# Patient Record
Sex: Male | Born: 1975 | Race: Black or African American | Hispanic: No | Marital: Single | Smoking: Current every day smoker
Health system: Southern US, Community
[De-identification: ages and names within clinical notes are randomized; demographics above are authoritative.]

## PROBLEM LIST (undated history)

## (undated) HISTORY — PX: STOMACH SURGERY: SHX791

---

## 1997-09-01 ENCOUNTER — Emergency Department (HOSPITAL_COMMUNITY): Admission: EM | Admit: 1997-09-01 | Discharge: 1997-09-01 | Payer: Self-pay | Admitting: Emergency Medicine

## 1998-10-11 ENCOUNTER — Emergency Department (HOSPITAL_COMMUNITY): Admission: EM | Admit: 1998-10-11 | Discharge: 1998-10-11 | Payer: Self-pay | Admitting: Emergency Medicine

## 1998-10-11 ENCOUNTER — Encounter: Payer: Self-pay | Admitting: Emergency Medicine

## 2003-08-02 ENCOUNTER — Emergency Department (HOSPITAL_COMMUNITY): Admission: EM | Admit: 2003-08-02 | Discharge: 2003-08-02 | Payer: Self-pay | Admitting: Emergency Medicine

## 2004-02-10 ENCOUNTER — Emergency Department (HOSPITAL_COMMUNITY): Admission: EM | Admit: 2004-02-10 | Discharge: 2004-02-10 | Payer: Self-pay | Admitting: Emergency Medicine

## 2010-08-13 ENCOUNTER — Observation Stay (HOSPITAL_COMMUNITY)
Admission: EM | Admit: 2010-08-13 | Discharge: 2010-08-13 | Disposition: A | Discharge status: Home / Self Care | Payer: Self-pay | Attending: General Surgery | Admitting: General Surgery

## 2010-08-13 DIAGNOSIS — S45809A Unspecified injury of other specified blood vessels at shoulder and upper arm level, unspecified arm, initial encounter: Secondary | ICD-10-CM

## 2010-08-13 DIAGNOSIS — S41109A Unspecified open wound of unspecified upper arm, initial encounter: Principal | ICD-10-CM | POA: Insufficient documentation

## 2010-08-13 DIAGNOSIS — Y92009 Unspecified place in unspecified non-institutional (private) residence as the place of occurrence of the external cause: Secondary | ICD-10-CM | POA: Insufficient documentation

## 2010-08-13 DIAGNOSIS — IMO0002 Reserved for concepts with insufficient information to code with codable children: Secondary | ICD-10-CM | POA: Insufficient documentation

## 2010-08-13 DIAGNOSIS — W261XXA Contact with sword or dagger, initial encounter: Secondary | ICD-10-CM

## 2010-08-13 DIAGNOSIS — Y998 Other external cause status: Secondary | ICD-10-CM | POA: Insufficient documentation

## 2010-08-13 DIAGNOSIS — W260XXA Contact with knife, initial encounter: Secondary | ICD-10-CM

## 2010-08-13 LAB — COMPREHENSIVE METABOLIC PANEL
ALT: 27 U/L (ref 0–53)
AST: 39 U/L — ABNORMAL HIGH (ref 0–37)
Albumin: 4.4 g/dL (ref 3.5–5.2)
Alkaline Phosphatase: 67 U/L (ref 39–117)
GFR calc Af Amer: >60 mL/min (ref >60)
Glucose, Bld: 139 mg/dL — ABNORMAL HIGH (ref 70–99)
Potassium: 3.6 mEq/L (ref 3.5–5.1)
Sodium: 137 mEq/L (ref 135–145)
Total Protein: 7.4 g/dL (ref 6.0–8.3)

## 2010-08-13 LAB — TYPE AND SCREEN
Unit division: 0
Unit division: 0

## 2010-08-13 LAB — CBC
HCT: 45.2 % (ref 39.0–52.0)
Hemoglobin: 16 g/dL (ref 13.0–17.0)
MCH: 30.9 pg (ref 26.0–34.0)
MCHC: 35.4 g/dL (ref 30.0–36.0)
MCV: 87.4 fL (ref 78.0–100.0)
Platelets: 297 10*3/uL (ref 150–400)
RBC: 5.17 MIL/uL (ref 4.22–5.81)
RDW: 13.3 % (ref 11.5–15.5)
WBC: 7.8 10*3/uL (ref 4.0–10.5)

## 2010-08-13 LAB — PROTIME-INR: INR: 0.88 (ref 0.00–1.49)

## 2010-08-13 LAB — LACTIC ACID, PLASMA: Lactic Acid, Venous: 7.4 mmol/L — ABNORMAL HIGH (ref 0.5–2.2)
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
## 2010-08-28 ENCOUNTER — Ambulatory Visit: Payer: Self-pay | Admitting: Vascular Surgery
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
## 2012-06-24 ENCOUNTER — Emergency Department (HOSPITAL_COMMUNITY)
Admission: EM | Admit: 2012-06-24 | Discharge: 2012-06-24 | Disposition: A | Discharge status: Home / Self Care | Payer: Self-pay | Attending: Emergency Medicine | Admitting: Emergency Medicine

## 2012-06-24 ENCOUNTER — Encounter (HOSPITAL_COMMUNITY): Payer: Self-pay | Admitting: Emergency Medicine

## 2012-06-24 ENCOUNTER — Emergency Department (HOSPITAL_COMMUNITY): Payer: Self-pay

## 2012-06-24 DIAGNOSIS — M79673 Pain in unspecified foot: Secondary | ICD-10-CM

## 2012-06-24 DIAGNOSIS — M79609 Pain in unspecified limb: Secondary | ICD-10-CM | POA: Insufficient documentation

## 2012-06-24 DIAGNOSIS — F17200 Nicotine dependence, unspecified, uncomplicated: Secondary | ICD-10-CM | POA: Insufficient documentation

## 2012-06-24 MED ORDER — IBUPROFEN 800 MG PO TABS
800.0000 mg | ORAL_TABLET | Freq: Once | ORAL | Status: AC
  Administered 2012-06-24: 800 mg via ORAL
  Filled 2012-06-24: qty 1

## 2012-06-24 MED ORDER — TRAMADOL HCL 50 MG PO TABS
50.0000 mg | ORAL_TABLET | Freq: Once | ORAL | Status: AC
  Administered 2012-06-24: 50 mg via ORAL
  Filled 2012-06-24: qty 1

## 2012-06-24 MED ORDER — TRAMADOL HCL 50 MG PO TABS: 50.0000 mg | ORAL_TABLET | Freq: Four times a day (QID) | ORAL | Status: AC | PRN
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
## 2012-06-24 NOTE — ED Notes (Signed)
Ortho tech bedside 

## 2012-06-25 NOTE — ED Provider Notes (Signed)
Medical screening examination/treatment/procedure(s) were performed by non-physician practitioner and as supervising physician I was immediately available for consultation/collaboration.   Laray Anger, DO 06/25/12 1222

## 2015-07-05 ENCOUNTER — Encounter (HOSPITAL_COMMUNITY): Payer: Self-pay | Admitting: *Deleted

## 2015-07-05 DIAGNOSIS — K0889 Other specified disorders of teeth and supporting structures: Secondary | ICD-10-CM | POA: Insufficient documentation

## 2015-07-05 DIAGNOSIS — K029 Dental caries, unspecified: Secondary | ICD-10-CM | POA: Insufficient documentation

## 2015-07-05 DIAGNOSIS — F17200 Nicotine dependence, unspecified, uncomplicated: Secondary | ICD-10-CM | POA: Insufficient documentation

## 2015-07-05 NOTE — ED Notes (Signed)
Pt c/o right upper dental pain x 2 weeks.  

## 2015-07-06 ENCOUNTER — Emergency Department (HOSPITAL_COMMUNITY)
Admission: EM | Admit: 2015-07-06 | Discharge: 2015-07-06 | Disposition: A | Discharge status: Home / Self Care | Payer: Self-pay | Attending: Emergency Medicine | Admitting: Emergency Medicine

## 2015-07-06 DIAGNOSIS — K0889 Other specified disorders of teeth and supporting structures: Secondary | ICD-10-CM

## 2015-07-06 MED ORDER — HYDROCODONE-ACETAMINOPHEN 5-325 MG PO TABS
1.0000 | ORAL_TABLET | Freq: Once | ORAL | Status: AC
  Administered 2015-07-06: 1 via ORAL
  Filled 2015-07-06: qty 1

## 2015-07-06 MED ORDER — PENICILLIN V POTASSIUM 500 MG PO TABS: 500.0000 mg | ORAL_TABLET | Freq: Four times a day (QID) | ORAL | Status: AC

## 2015-07-06 MED ORDER — PENICILLIN V POTASSIUM 250 MG PO TABS
500.0000 mg | ORAL_TABLET | Freq: Once | ORAL | Status: AC
  Administered 2015-07-06: 500 mg via ORAL
  Filled 2015-07-06: qty 2

## 2015-07-06 MED ORDER — HYDROCODONE-ACETAMINOPHEN 5-325 MG PO TABS: 1.0000 | ORAL_TABLET | Freq: Four times a day (QID) | ORAL | Status: AC | PRN

## 2015-07-06 NOTE — ED Provider Notes (Signed)
CSN: [id number]     Arrival date & time 07/05/15  2357 History   First MD Initiated Contact with Patient 07/06/15 0014     Chief Complaint  Patient presents with  . Dental Pain   Patient is a 40 y.o. male presenting with tooth pain.  Dental Pain Location:  Upper Upper teeth location:  1/RU 3rd molar Quality:  Aching Severity:  Severe Onset quality:  Gradual Duration:  2 weeks Timing:  Constant Progression:  Worsening Context: dental caries   Relieved by:  Nothing Worsened by:  Hot food/drink, cold food/drink and touching Ineffective treatments:  Ice Associated symptoms: no difficulty swallowing, no drooling, no facial pain, no facial swelling, no fever, no neck swelling and no trismus    Joshua Mcmillan is a 40 year old male presenting with dental pain. Onset of pain was 2 weeks ago. It is located in his right upper molar. The pain is "bearable" during the day but worsens at night. He states that tonight is the worst it has ever been. He does not have a dentist. He denies difficulty swallowing, handling secretions or breathing. Denies fevers, chills, headaches, facial swelling, facial redness, neck pain, neck swelling, chest pain or SOB.   History reviewed. No pertinent past medical history. Past Surgical History  Procedure Laterality Date  . Stomach surgery     No family history on file. Social History  Substance Use Topics  . Smoking status: Current Every Day Smoker  . Smokeless tobacco: None  . Alcohol Use: No    Review of Systems  Constitutional: Negative for fever.  HENT: Positive for dental problem. Negative for drooling and facial swelling.   All other systems reviewed and are negative.     Allergies  Review of patient's allergies indicates no known allergies.  Home Medications   Prior to Admission medications   Medication Sig Start Date End Date Taking? Authorizing Provider  HYDROcodone-acetaminophen (NORCO/VICODIN) 5-325 MG tablet Take 1 tablet by mouth every 6  (six) hours as needed. 07/06/15   Christeen Lai, PA-C  penicillin v potassium (VEETID) 500 MG tablet Take 1 tablet (500 mg total) by mouth 4 (four) times daily. 07/06/15 07/13/15  Nealie Mchatton, PA-C  traMADol (ULTRAM) 50 MG tablet Take 1 tablet (50 mg total) by mouth every 6 (six) hours as needed for pain. 06/24/12   Robyn M Hess, PA-C   BP 145/101 mmHg  Pulse 73  Temp(Src) 97.6 F (36.4 C) (Oral)  Resp 14  SpO2 98% Physical Exam  Constitutional: He appears well-developed and well-nourished. No distress.  HENT:  Head: Normocephalic and atraumatic.  Right Ear: External ear normal.  Left Ear: External ear normal.  Mouth/Throat: Uvula is midline and oropharynx is clear and moist. Mucous membranes are not dry. No trismus in the jaw. No dental abscesses or uvula swelling.  Severe dental decay of right upper 3rd molar. No erythema or drainage from the surrounding gumline. No obvious dental abscess. No trismus. No erythema or soft tissue swelling of the cheek.   Eyes: Conjunctivae are normal. Right eye exhibits no discharge. Left eye exhibits no discharge. No scleral icterus.  Neck: Normal range of motion. Neck supple.  FROM of neck intact without pain. No TTP. No cervical adenopathy. No soft tissue swelling of the neck.   Cardiovascular: Normal rate.   Pulmonary/Chest: Effort normal. No stridor. No respiratory distress.  Musculoskeletal: Normal range of motion.  Lymphadenopathy:    He has no cervical adenopathy.  Neurological: He is alert. Coordination normal.  Skin: Skin is warm and dry.  Psychiatric: He has a normal mood and affect. His behavior is normal.  Nursing note and vitals reviewed.   ED Course  Procedures (including critical care time) Labs Review Labs Reviewed - No data to display  Imaging Review No results found. I have personally reviewed and evaluated these images and lab results as part of my medical decision-making.   EKG Interpretation None      MDM   Final  diagnoses:  Pain, dental   Patient presenting with tooth pain. No obvious abscess on exam. No soft tissue swelling of the cheek or neck. Exam unconcerning for Ludwig's angina or spread of infection.  Pain controlled in ED with vicodin. Will discharge with penicillin and encouraged pt to follow-up with dentist. Resource guide given in discharge paperwork. Return precautions discussed with pt and given in discharge paperwork. Stable for discharge.      Rolm Gala Anyla Israelson, PA-C 07/06/15 1610  Shon Baton, MD 07/06/15 [phone]

## 2015-07-06 NOTE — ED Notes (Signed)
Patient able to ambulate independently  

## 2015-07-06 NOTE — Discharge Instructions (Signed)
Dental Pain  ° ° °Dental pain may be caused by many things, including:  °Tooth decay (cavities or caries). Cavities expose the nerve of your tooth to air and hot or cold temperatures. This can cause pain or discomfort.  °Abscess or infection. A dental abscess is a collection of infected pus from a bacterial infection in the inner part of the tooth (pulp). It usually occurs at the end of the tooth's root.  °Injury.  °An unknown reason (idiopathic). °Your pain may be mild or severe. It may only occur when:  °You are chewing.  °You are exposed to hot or cold temperature.  °You are eating or drinking sugary foods or beverages, such as soda or candy. °Your pain may also be constant.  °HOME CARE INSTRUCTIONS  °Watch your dental pain for any changes. The following actions may help to lessen any discomfort that you are feeling:  °Take medicines only as directed by your dentist.  °If you were prescribed an antibiotic medicine, finish all of it even if you start to feel better.  °Keep all follow-up visits as directed by your dentist. This is important.  °Do not apply heat to the outside of your face.  °Rinse your mouth or gargle with salt water if directed by your dentist. This helps with pain and swelling.  °You can make salt water by adding ¼ tsp of salt to 1 cup of warm water. °Apply ice to the painful area of your face:  °Put ice in a plastic bag.  °Place a towel between your skin and the bag.  °Leave the ice on for 20 minutes, 2-3 times per day. °Avoid foods or drinks that cause you pain, such as:  °Very hot or very cold foods or drinks.  °Sweet or sugary foods or drinks. °SEEK MEDICAL CARE IF:  °Your pain is not controlled with medicines.  °Your symptoms are worse.  °You have new symptoms. °SEEK IMMEDIATE MEDICAL CARE IF:  °You are unable to open your mouth.  °You are having trouble breathing or swallowing.  °You have a fever.  °Your face, neck, or jaw is swollen. °This information is not intended to replace advice  given to you by your health care provider. Make sure you discuss any questions you have with your health care provider.  °Document Released: 04/22/2005 Document Revised: 09/06/2014 Document Reviewed: 04/18/2014  °Elsevier Interactive Patient Education ©2016 Elsevier Inc.  ° ° °Emergency Department Resource Guide °1) Find a Doctor and Pay Out of Pocket °Although you won't have to find out who is covered by your insurance plan, it is a good idea to ask around and get recommendations. You will then need to call the office and see if the doctor you have chosen will accept you as a new patient and what types of options they offer for patients who are self-pay. Some doctors offer discounts or will set up payment plans for their patients who do not have insurance, but you will need to ask so you aren't surprised when you get to your appointment. ° °2) Contact Your Local Health Department °Not all health departments have doctors that can see patients for sick visits, but many do, so it is worth a call to see if yours does. If you don't know where your local health department is, you can check in your phone book. The CDC also has a tool to help you locate your state's health department, and many state websites also have listings of all of their local health departments. ° °  3) Find a Walk-in Clinic °If your illness is not likely to be very severe or complicated, you may want to try a walk in clinic. These are popping up all over the country in pharmacies, drugstores, and shopping centers. They're usually staffed by nurse practitioners or physician assistants that have been trained to treat common illnesses and complaints. They're usually fairly quick and inexpensive. However, if you have serious medical issues or chronic medical problems, these are probably not your best option. ° °No Primary Care Doctor: °- Call [facility] at  832-8000 - they can help you locate a primary care doctor that  accepts your insurance,  provides certain services, etc. °- Physician Referral Service- 1-800-533-3463 ° °Chronic Pain Problems: °Organization         Address  Phone   Notes  °[facility]  (336) 297-2271 Patients need to be referred by their primary care doctor.  ° °Medication Assistance: °Organization         Address  Phone   Notes  °[place] [facility] [address] °[place], [place] [address] (336) 641-8030 --Must be a resident of [place] °-- Must have NO insurance coverage whatsoever (no Medicaid/ Medicare, etc.) °-- The pt. MUST have a primary care doctor that directs their care regularly and follows them in the community °  °[facility]  (866) 331-1348   °[employer]  (888) 892-1162   ° °Agencies that provide inexpensive medical care: °Organization         Address  Phone   Notes  °[facility]  (336) 832-8035   °[facility]    (336) 832-7272   °[facility] [address] °[place], [place] [address] (336) 832-4777   °[facility] [address], °[place] (336) 271-4999   °[facility]    (336) 373-0678   °[facility]    (336) 272-1050   °[facility] ° [address], [place] Phone:  (336) 832-4444, Fax:  (336) 832-4440 Hours of Operation:  9 am - 6 pm, M-F.  Also accepts Medicaid/Medicare and self-pay.  °[facility] ° [address], [place] Phone: (336) 832-3150, Fax: (336) 832-3151. Hours of Operation:  8:30 am - 5:30 pm, M-F.  Also accepts Medicaid and self-pay.  °[facility] [place] [address], [place] Phone: (336) 878-6027   °[facility] [address], [place], [place] (336)723-1848, Ext. 123 Mondays & Thursdays: 7-9 AM.  First 15 patients are seen on a first come, first serve basis. °  ° °Medicaid-accepting [place] Providers: ° °Organization         Address  Phone    Notes  °[facility] [address], [place] (336) 641-2100 Also accepts self-pay patients.  °[facility] [address], [place] ° (336) 856-9996   °[facility] [address], [place] (336) 288-8857   °[facility] [address] [place] [address], [place] (336) 299-7000   °Veita Bland [address],   ° (336) 373-1557 Only accepts [place] Access Medicaid patients after they have their name applied to their card.  ° °Self-Pay (no insurance) in [place]: ° °Organization         Address  Phone   Notes  °Sickle Cell Patients, [facility] [address], [place] (336) 832-1970   °[facility] [address], [place] (336) 832-4400   °[facility] ° [address], [place] (336) 992-4800   °[facility]/Dr. Osei-Bonsu ° [address] [place] [address], [place] or [address], [place] (336) 841-8500 Phone number for both [place] and [place] locations is the same.  °[facility] [address], [place] (336) 299-0000   °[facility] [address] [place] [address], [place] or [address] (336) 852-7530 °(336) 878-2260   °[facility] [address], [place] (336) 350-1642, phone; (336) 294-5005, fax Sees patients 1st and 3rd Saturday of every month.  Must not qualify for public or private insurance (i.e. Medicaid, Medicare, [place] Health Choice, Veterans' Benefits) • Household income should be no more than 200% of the poverty level •The clinic cannot treat you if you are pregnant or think you are pregnant • Sexually transmitted diseases are not treated at the clinic.  ° ° °Dental Care: °Organization         Address  Phone  Notes  °[facility] [facility] [address], [place] (336)  641-6152 Accepts children up to age 21 who are enrolled in Medicaid or [place] Health Choice; pregnant women with a Medicaid card; and children who have applied for Medicaid or [place] Health Choice, but were declined, whose parents can pay a reduced fee at time of service.  °[facility] [place]  [address], [place] (336) 641-7733 Accepts children up to age 21 who are enrolled in Medicaid or [place] Health Choice; pregnant women with a Medicaid card; and children who have applied for Medicaid or [place] Health Choice, but were declined, whose parents can pay a reduced fee at time of service.  °[facility] ° [address], [place] (336) 641-4533 Patients are seen by appointment only. Walk-ins are not accepted. [facility] will see patients 18 years of age and older. °Monday - Tuesday (8am-5pm) °Most Wednesdays (8:30-5pm) °$30 per visit, cash only  °[facility] ° [address], [place] (336) 641-4533 Patients are seen by appointment only. Walk-ins are not accepted. [facility] will see patients 18 years of age and older. °One Wednesday Evening (Monthly: Volunteer Based).  $30 per visit, cash only  °[facility]  (919) 537-3737 for adults; Children under age 4, call Graduate Pediatric Dentistry at (919) [phone]. Children aged 4-14, please call (919) 537-3737 to request a pediatric application. ° Dental services are provided in all areas of dental care including fillings, crowns and bridges, complete and partial dentures, implants, gum treatment, root canals, and extractions. Preventive care is also provided. Treatment is provided to both adults and children. °Patients are selected via a lottery and there is often a waiting list. °  °[facility] [address], ° ° (336) 763-8833 [email or web address] °  °[facility] [address], [place], [place] (336)723-1848, Ext.  123 Second and Fourth Thursday of each month, opens at 6:30 AM; Clinic ends at 9 AM.  Patients are seen on a first-come first-served basis, and a limited number are seen during each clinic.  ° °[facility] ° [address], [place],  (336) 723-7904   Eligibility Requirements °You must have lived in [place],   [place], or [place] counties for at least the last three months. °  You cannot be eligible for state or federal sponsored healthcare insurance, including Veterans Administration, Medicaid, or Medicare. °  You generally cannot be eligible for healthcare insurance through your employer.  °  How to apply: °Eligibility screenings are held every Tuesday and Wednesday afternoon from 1:00 pm until 4:00 pm. You do not need an appointment for the interview!  °[facility] [address], [place], [place] 336-631-2330   °[facility] [facility]  336-342-8273   °[facility]  336-703-3100   °[facility]  336-570-6415   ° °Behavioral Health Resources in the Community: °Intensive Outpatient Programs °Organization         Address  Phone  Notes  °[facility] [address], [place], [place] 336-878-6098   °[facility] [address], [place], [place] 336-832-9800   °[facility] [address], [place], [place] ° 336-882-2125   °[facility] [address],  °[place], [place] 1-800-853-5163 or 336-641-4981   °Substance Abuse Resources °Organization         Address  Phone  Notes  °[facility]  336-882-2125   °[facility]  336-784-9470   °[facility]  336-285-9073   °[facility]  336-845-3988   °Residential & Outpatient Substance Abuse Program  1-800-659-3381   °Psychological Services °Organization         Address  Phone  Notes  °[facility]  336- 832-9600   °[facility]  336- 378-7881   °[place] [address], [place] 1-800-853-5163 or 336-641-4981   ° °Mobile Crisis Teams °Organization         Address  Phone  Notes  °[facility], Mobile Crisis Care Unit  1-877-626-1772   °Assertive °[facility] ° [address] [place], [place] 336-834-9664   °Sharon DeEsch [address] °[place] 336-554-5454   ° °Self-Help/Support Groups °Organization         Address  Phone             Notes  °[facility] - variety of support groups  336- 373-1402 Call for more information  °Narcotics Anonymous (NA), [facility] [address], °[place]  2 meetings at this location  ° °Residential Treatment Programs °Organization         Address  Phone  Notes  °[facility] [address],    °[place]  1-866-801-8205   °[facility] ° [address], [place], [place] 704-293-8524   °[facility] [address], [place] 336-845-3988 Admissions: 8am-3pm M-F  °[facility] [address]    °[place], [place] 336-841-1104   °[facility] [address], [place], [place] 336-379-7146   °[facility] [address]  °[place], [place] 336-285-9073   °[facility] - Intensive Outpatient [address], [place], [place] 336-852-3033   °[facility] [address]  °[place], [place] 1-877-615-2722 or 336-784-9470   °[facility] [address] [place],  336-227-7417 Accepts Medicaid  °[facility] [address]  °[place]  1-800-659-3381 Substance Abuse/Addiction Treatment  ° °[place] Behavioral Health Resources °Organization         Address  Phone  Notes  °[facility]  (888) 581-9988   °Julie Brannon, PhD [address] [place], [place]   (336) 349-5553 or (336) 951-0000   °[facility]   [address] °[place], [place] (336) 349-4454   °[facility] [address], [place], [place] (336) 342-8316 Insurance/Medicaid/sponsorship through [facility]  °[facility] [address] [place]                                    [place], [place] (336) 342-8316 Therapy/tele-psych/case  °[facility] [address]  ° [place], [place] (336) 349-2233    °Dr. Arfeen  (336) 349-4544   °[facility] [place]  [employer] [place] [facility] 1) [address], [place] °2) [address], [place] °3)  [address], [place] (336) 349-3220 °(336) 342-7768 ° °(336) 342-8140   °[place] [facility] (336) 342-1394 or (336) 342-3537 (After Hours)    ° ° ° °

## 2015-09-21 ENCOUNTER — Emergency Department (HOSPITAL_COMMUNITY)
Admission: EM | Admit: 2015-09-21 | Discharge: 2015-09-21 | Disposition: A | Discharge status: Home / Self Care | Payer: Self-pay | Attending: Emergency Medicine | Admitting: Emergency Medicine

## 2015-09-21 ENCOUNTER — Encounter (HOSPITAL_COMMUNITY): Payer: Self-pay | Admitting: Emergency Medicine

## 2015-09-21 DIAGNOSIS — K0889 Other specified disorders of teeth and supporting structures: Secondary | ICD-10-CM | POA: Insufficient documentation

## 2015-09-21 DIAGNOSIS — R51 Headache: Secondary | ICD-10-CM | POA: Insufficient documentation

## 2015-09-21 DIAGNOSIS — K0381 Cracked tooth: Secondary | ICD-10-CM | POA: Insufficient documentation

## 2015-09-21 DIAGNOSIS — F17210 Nicotine dependence, cigarettes, uncomplicated: Secondary | ICD-10-CM | POA: Insufficient documentation

## 2015-09-21 DIAGNOSIS — K029 Dental caries, unspecified: Secondary | ICD-10-CM | POA: Insufficient documentation

## 2015-09-21 DIAGNOSIS — H9201 Otalgia, right ear: Secondary | ICD-10-CM | POA: Insufficient documentation

## 2015-09-21 MED ORDER — HYDROCODONE-ACETAMINOPHEN 5-325 MG PO TABS: 1.0000 | ORAL_TABLET | Freq: Once | ORAL | Status: DC

## 2015-09-21 MED ORDER — AMOXICILLIN 500 MG PO CAPS: 500.0000 mg | ORAL_CAPSULE | Freq: Three times a day (TID) | ORAL | Status: DC

## 2015-09-21 MED ORDER — AMOXICILLIN 500 MG PO CAPS
500.0000 mg | ORAL_CAPSULE | Freq: Once | ORAL | Status: AC
  Administered 2015-09-21: 500 mg via ORAL
  Filled 2015-09-21: qty 1

## 2015-09-21 MED ORDER — OXYCODONE-ACETAMINOPHEN 5-325 MG PO TABS
1.0000 | ORAL_TABLET | ORAL | Status: DC | PRN
  Administered 2015-09-21: 1 via ORAL
  Filled 2015-09-21: qty 1

## 2015-09-21 MED ORDER — NAPROXEN 500 MG PO TABS: 500.0000 mg | ORAL_TABLET | Freq: Two times a day (BID) | ORAL | Status: DC

## 2015-09-21 NOTE — ED Notes (Signed)
Pt presents to ED for assessment of right sided dental pain.  Pt has been seen int he past for this same pain, was given penicillin and pain medication.  Pt sts he "thought I had beat it" so he did not follow up with a dentist.  Pt woke up today with severe pain, now radiating to his head.  Pt tearful during triage

## 2015-09-21 NOTE — ED Provider Notes (Signed)
CSN: [id number]     Arrival date & time 09/21/15  1624 History  By signing my name below, I, Phillis Haggis, attest that this documentation has been prepared under the direction and in the presence of [facility], NP-C. Electronically Signed: Phillis Haggis, ED Scribe. 09/21/2015. 4:57 PM.   Chief Complaint  Patient presents with  . Dental Pain   Patient is a 40 y.o. male presenting with tooth pain. The history is provided by the patient. No language interpreter was used.  Dental Pain Location:  Upper Upper teeth location:  1/RU 3rd molar Quality:  Aching Severity:  Mild Onset quality:  Gradual Timing:  Constant Progression:  Worsening Chronicity:  Recurrent Context: poor dentition   Ineffective treatments:  Acetaminophen Associated symptoms: headaches   Associated symptoms: no fever   HPI Comments: Joshua Mcmillan is a 40 y.o. male who presents to the Emergency Department complaining of right upper molar dental pain onset earlier today. Pt reports that he has been seen in the past for the same pain, was prescribed pain medication and penicillin. He states that he had not followed up with a dentist because he "thought he had beat it." He woke up with worsening pain this morning that radiates to his head with right ear pain. He denies fever, chills, sore throat, nausea, or vomiting.    History reviewed. No pertinent past medical history. Past Surgical History  Procedure Laterality Date  . Stomach surgery     History reviewed. No pertinent family history. Social History  Substance Use Topics  . Smoking status: Current Every Day Smoker -- 0.50 packs/day    Types: Cigarettes  . Smokeless tobacco: None  . Alcohol Use: Yes     Comment: "on the weekends"    Review of Systems  Constitutional: Negative for fever and chills.  HENT: Positive for dental problem and ear pain. Negative for sore throat.   Neurological: Positive for headaches.  All other systems reviewed and are  negative.  Allergies  Review of patient's allergies indicates no known allergies.  Home Medications   Prior to Admission medications   Medication Sig Start Date End Date Taking? Authorizing Provider  amoxicillin (AMOXIL) 500 MG capsule Take 1 capsule (500 mg total) by mouth 3 (three) times daily. 09/21/15   Hope Orlene Och, NP  HYDROcodone-acetaminophen (NORCO/VICODIN) 5-325 MG tablet Take 1 tablet by mouth every 6 (six) hours as needed. 07/06/15   Stevi Barrett, PA-C  naproxen (NAPROSYN) 500 MG tablet Take 1 tablet (500 mg total) by mouth 2 (two) times daily. 09/21/15   Hope Orlene Och, NP  traMADol (ULTRAM) 50 MG tablet Take 1 tablet (50 mg total) by mouth every 6 (six) hours as needed for pain. 06/24/12   Robyn M Hess, PA-C   BP 161/90 mmHg  Pulse 75  Temp(Src) 98.3 F (36.8 C) (Oral)  Resp 18  SpO2 99% Physical Exam  Constitutional: He is oriented to person, place, and time. He appears well-developed and well-nourished. No distress.  HENT:  Head: Normocephalic and atraumatic.  Mouth/Throat: Oropharynx is clear and moist. No dental abscesses. No oropharyngeal exudate.  Right upper 3rd molar with decay and is broken  Eyes: Conjunctivae and EOM are normal. Pupils are equal, round, and reactive to light.  Neck: Normal range of motion. Neck supple.  Musculoskeletal: Normal range of motion.  Lymphadenopathy:    He has no cervical adenopathy.  Neurological: He is alert and oriented to person, place, and time.  Skin: Skin is warm and  dry.  Psychiatric: He has a normal mood and affect. His behavior is normal.    ED Course  Procedures (including critical care time) DIAGNOSTIC STUDIES: Oxygen Saturation is 99% on RA, normal by my interpretation.    COORDINATION OF CARE: 4:56 PM-Discussed treatment plan which includes naprosyn and amoxicillin with pt at bedside and pt agreed to plan.    MDM  Patient with toothache.  No gross abscess.  Exam unconcerning for Ludwig's angina or spread of  infection.  Will treat with amoxicillin and naprosyn.  Urged patient to follow-up with dentist.    Final diagnoses:  Pain due to dental caries   I personally performed the services described in this documentation, which was scribed in my presence. The recorded information has been reviewed and is accurate.    8925 Gulf CourtHope MillsM Neese, NP 09/21/15 1705  Raeford RazorStephen Kohut, MD 09/28/15 [phone]

## 2015-09-21 NOTE — Discharge Instructions (Signed)
Dental Caries °Dental caries is tooth decay. This decay can cause a hole in teeth (cavity) that can get bigger and deeper over time. °HOME CARE °· Brush and floss your teeth. Do this at least two times a day. °· Use a fluoride toothpaste. °· Use a mouth rinse if told by your dentist or doctor. °· Eat less sugary and starchy foods. Drink less sugary drinks. °· Avoid snacking often on sugary and starchy foods. Avoid sipping often on sugary drinks. °· Keep regular checkups and cleanings with your dentist. °· Use fluoride supplements if told by your dentist or doctor. °· Allow fluoride to be applied to teeth if told by your dentist or doctor. °  °This information is not intended to replace advice given to you by your health care provider. Make sure you discuss any questions you have with your health care provider. °  °Document Released: 01/30/2008 Document Revised: 05/13/2014 Document Reviewed: 04/24/2012 °Elsevier Interactive Patient Education ©2016 Elsevier Inc. °Community Resource Guide Dental °The United Way’s “211” is a great source of information about community services available.  Access by dialing 2-1-1 from anywhere in [place], or by website -  www.nc211.org.  ° °Other Local Resources (Updated 05/2015) ° °Dental  Care °  °Services ° °  °Phone Number and Address  °Cost  °[facility] For children 0 - 21 years of age:  °• Cleaning °• Tooth brushing/flossing instruction °• Sealants, fillings, crowns °• Extractions °• Emergency treatment  [phone] °[address] °[place], [place] [address] Charges based on family income.  Medicaid and some insurance plans accepted.   °  °[facility] [place] • Cleaning °• Sealants, fillings, crowns °• Extractions °• Emergency treatment [phone] °[address] °[place], [place] ° Pregnant women 18 years of age or older with a Medicaid card  °[facility] [place] • Cleaning °• Sealants, fillings, crowns °• Extractions °• Emergency treatment [phone] °[address] °[place], [place] Pregnant women 18 years of age or older with a Medicaid card  °[facility] For children 0 - 21 years of age:  °• Cleaning °• Tooth brushing/flossing instruction °• Sealants, fillings, crowns °• Extractions °• Emergency treatment °Limited orthodontic services for patients with Medicaid [phone] °[address] °[place], [place] [address] Medicaid and [place] Health Choice cover for children up to age 21 and pregnant women.  Parents of children up to age 21 without Medicaid pay a reduced fee at time of service.  °[facility] [place] For children 0 - 21 years of age:  °• Cleaning °• Tooth brushing/flossing instruction °• Sealants, fillings, crowns °• Extractions °• Emergency treatment °Limited orthodontic services for patients with Medicaid [phone] °[address] °[place], [place].  Medicaid and [place] Health Choice cover for children up to age 21 and pregnant women.  Parents of children up to age 21 without Medicaid pay a reduced fee.  °[facility] • Cleaning °• Sealants, fillings, crowns °• Extractions ° °Hours: Tuesdays and Thursdays, 4:15 - 8 pm [phone] °[address] °[place],  [address] Services free of charge to [place] residents ages 18-64 who do not have health insurance, Medicare, Medicaid, or VA benefits and fall within federal poverty guidelines  °[facility] ° ° ° Provides dental care in addition to primary medical care, nutritional counseling,   and pharmacy: °• Cleaning °• Sealants, fillings, crowns °• Extractions ° ° ° ° ° ° ° ° ° ° ° ° ° ° ° ° ° [phone] °[facility], [address] °[place], [place] ° °[phone] °[facility], [address] [place], [place] ° °[phone] °[facility] °[place], [place] ° °[phone] °[facility], [address] °[place], [place] ° °[phone] °[facility] °[address] °[place], [place] Accepts Medicaid, Medicare, most insurance.  Also provides services available to all with fees adjusted based on ability to pay.    °[facility] • Cleaning °• Tooth brushing/flossing instruction °• Sealants, fillings, crowns °• Extractions °• Emergency treatment °Hours: Tuesdays, Thursdays, and Fridays from 8 am to 5 pm by appointment only. [phone] °[address] °[place], [place] [address] [place] residents with Medicaid (depending on eligibility) and children with [place] Health Choice - call for more information.  °[facility] • Extractions only ° °Hours: 2nd and 4th Thursday of each month from 6:30 am - 9 am.   [phone] ext. [phone] °[address] °[place], [place] [address] Ages 18 and older only.  Patients are seen on a first come, first served basis.  °[facility] • Cleanings °• Fillings °• Extractions °• Orthodontics °• Endodontics °• Implants/Crowns/Bridges °• Complete and partial dentures [phone] °[place], [place] Patients must complete an application for services.  There is often a waiting list.   ° °

## 2015-09-21 NOTE — ED Notes (Signed)
Patient able to ambulate independently  

## 2016-02-07 ENCOUNTER — Encounter (HOSPITAL_COMMUNITY): Payer: Self-pay | Admitting: Emergency Medicine

## 2016-02-07 ENCOUNTER — Emergency Department (HOSPITAL_COMMUNITY): Payer: Self-pay

## 2016-02-07 ENCOUNTER — Emergency Department (HOSPITAL_COMMUNITY)
Admission: EM | Admit: 2016-02-07 | Discharge: 2016-02-07 | Disposition: A | Discharge status: Home / Self Care | Payer: Self-pay | Attending: Emergency Medicine | Admitting: Emergency Medicine

## 2016-02-07 DIAGNOSIS — F17210 Nicotine dependence, cigarettes, uncomplicated: Secondary | ICD-10-CM | POA: Insufficient documentation

## 2016-02-07 DIAGNOSIS — R0789 Other chest pain: Secondary | ICD-10-CM | POA: Insufficient documentation

## 2016-02-07 DIAGNOSIS — R030 Elevated blood-pressure reading, without diagnosis of hypertension: Secondary | ICD-10-CM | POA: Insufficient documentation

## 2016-02-07 LAB — BASIC METABOLIC PANEL
Anion gap: 10 (ref 5–15)
BUN: 10 mg/dL (ref 6–20)
CALCIUM: 9.8 mg/dL (ref 8.9–10.3)
CHLORIDE: 104 mmol/L (ref 101–111)
CO2: 27 mmol/L (ref 22–32)
CREATININE: 1.04 mg/dL (ref 0.61–1.24)
Glucose, Bld: 104 mg/dL — ABNORMAL HIGH (ref 65–99)
Potassium: 4.4 mmol/L (ref 3.5–5.1)
SODIUM: 141 mmol/L (ref 135–145)

## 2016-02-07 LAB — CBC WITH DIFFERENTIAL/PLATELET
BASOS PCT: 0 %
Basophils Absolute: 0 10*3/uL (ref 0.0–0.1)
EOS ABS: 0 10*3/uL (ref 0.0–0.7)
EOS PCT: 0 %
HCT: 46.2 % (ref 39.0–52.0)
HEMOGLOBIN: 15.6 g/dL (ref 13.0–17.0)
Lymphocytes Relative: 30 %
Lymphs Abs: 2.4 10*3/uL (ref 0.7–4.0)
MCH: 30.3 pg (ref 26.0–34.0)
MCHC: 33.8 g/dL (ref 30.0–36.0)
MCV: 89.7 fL (ref 78.0–100.0)
MONOS PCT: 4 %
Monocytes Absolute: 0.4 10*3/uL (ref 0.1–1.0)
NEUTROS PCT: 66 %
Neutro Abs: 5.2 10*3/uL (ref 1.7–7.7)
PLATELETS: 257 10*3/uL (ref 150–400)
RBC: 5.15 MIL/uL (ref 4.22–5.81)
RDW: 14.1 % (ref 11.5–15.5)
WBC: 8.1 10*3/uL (ref 4.0–10.5)

## 2016-02-07 LAB — I-STAT TROPONIN, ED
TROPONIN I, POC: 0 ng/mL (ref 0.00–0.08)
Troponin i, poc: 0.02 ng/mL (ref 0.00–0.08)

## 2016-02-07 MED ORDER — ONDANSETRON HCL 4 MG/2ML IJ SOLN
4.0000 mg | Freq: Once | INTRAMUSCULAR | Status: AC
  Administered 2016-02-07: 4 mg via INTRAVENOUS
  Filled 2016-02-07: qty 2

## 2016-02-07 MED ORDER — IOPAMIDOL (ISOVUE-370) INJECTION 76%
INTRAVENOUS | Status: AC
  Administered 2016-02-07: 100 mL
  Filled 2016-02-07: qty 100

## 2016-02-07 MED ORDER — METHOCARBAMOL 500 MG PO TABS: 1000.0000 mg | ORAL_TABLET | Freq: Four times a day (QID) | ORAL | 0 refills | Status: DC | PRN

## 2016-02-07 MED ORDER — MORPHINE SULFATE (PF) 4 MG/ML IV SOLN
4.0000 mg | Freq: Once | INTRAVENOUS | Status: AC
  Administered 2016-02-07: 4 mg via INTRAVENOUS
  Filled 2016-02-07: qty 1

## 2016-02-07 MED ORDER — HYDROMORPHONE HCL 1 MG/ML IJ SOLN
0.5000 mg | Freq: Once | INTRAMUSCULAR | Status: AC
  Administered 2016-02-07: 0.5 mg via INTRAVENOUS
  Filled 2016-02-07: qty 1

## 2016-02-07 NOTE — ED Provider Notes (Signed)
[facility]-EMERGENCY DEPT Provider Note   CSN: [id number] Arrival date & time: 02/07/16  1191     History   Chief Complaint Chief Complaint  Patient presents with  . Chest Pain  . Shoulder Pain    HPI  Joshua Mcmillan is a 40 y.o. male brought in by EMS complaining of severe left posterior shoulder pain radiating to the left chest which he describes as pressure-like, 10 out of 10, woke him from sleep at night onset yesterday at 7 AM. No pain medication taken prior to arrival, patient denies fever, chills, cough, shortness of breath, nausea, vomiting, diaphoresis, trauma, history of DVT/PE, recent immobilizations, calf pain, leg swelling.  She was given full dose aspirin and one sublingual nitroglycerin by EMS, states that his pain has not improved. Patient is right-hand-dominant.  HPI  History reviewed. No pertinent past medical history.  There are no active problems to display for this patient.   Past Surgical History:  Procedure Laterality Date  . STOMACH SURGERY         Home Medications    Prior to Admission medications   Medication Sig Start Date End Date Taking? Authorizing Provider  HYDROcodone-acetaminophen (NORCO/VICODIN) 5-325 MG tablet Take 1 tablet by mouth every 6 (six) hours as needed. Patient not taking: Reported on 02/07/2016 07/06/15   Rolm Gala Barrett, PA-C  methocarbamol (ROBAXIN) 500 MG tablet Take 2 tablets (1,000 mg total) by mouth 4 (four) times daily as needed (Pain). 02/07/16   Louanne Calvillo, PA-C  naproxen (NAPROSYN) 500 MG tablet Take 1 tablet (500 mg total) by mouth 2 (two) times daily. Patient not taking: Reported on 02/07/2016 09/21/15   Janne Napoleon, NP  traMADol (ULTRAM) 50 MG tablet Take 1 tablet (50 mg total) by mouth every 6 (six) hours as needed for pain. Patient not taking: Reported on 02/07/2016 06/24/12   Kathrynn Speed, PA-C    Family History No family history on file.  Social History Social History  Substance Use Topics  . Smoking status:  Current Every Day Smoker    Packs/day: 0.50    Types: Cigarettes  . Smokeless tobacco: Never Used  . Alcohol use Yes     Comment: "on the weekends"     Allergies   Review of patient's allergies indicates no known allergies.   Review of Systems Review of Systems   10 systems reviewed and found to be negative, except as noted in the HPI.  Physical Exam Updated Vital Signs BP 155/87 (BP Location: Right Arm)   Pulse 78   Temp 98.1 F (36.7 C) (Oral)   Resp 18   Ht 5\' 3"  (1.6 m)   Wt 63.5 kg   SpO2 99%   BMI 24.80 kg/m   Physical Exam  Constitutional: He is oriented to person, place, and time. He appears well-developed and well-nourished. No distress.  Tearful, in severe pain  HENT:  Head: Normocephalic and atraumatic.  Mouth/Throat: Oropharynx is clear and moist.  Eyes: Conjunctivae and EOM are normal. Pupils are equal, round, and reactive to light.  Neck: Normal range of motion. No JVD present. No tracheal deviation present.  Cardiovascular: Normal rate, regular rhythm and intact distal pulses.   Radial pulse equal bilaterally  Pulmonary/Chest: Effort normal and breath sounds normal. No stridor. No respiratory distress. He has no wheezes. He has no rales. He exhibits no tenderness.  Abdominal: Soft. He exhibits no distension and no mass. There is no tenderness. There is no rebound and no guarding.  Musculoskeletal: Normal  range of motion. He exhibits no edema or tenderness.       Arms: Full range of motion to left shoulder, point tenderness to palpation as diagrammed.  No calf asymmetry, superficial collaterals, palpable cords, edema, Homans sign negative bilaterally.    Neurological: He is alert and oriented to person, place, and time.  Skin: Skin is warm. He is not diaphoretic.  Psychiatric: He has a normal mood and affect.  Nursing note and vitals reviewed.    ED Treatments / Results  Labs (all labs ordered are listed, but only abnormal results are  displayed) Labs Reviewed  BASIC METABOLIC PANEL - Abnormal; Notable for the following:       Result Value   Glucose, Bld 104 (*)    All other components within normal limits  CBC WITH DIFFERENTIAL/PLATELET  Rosezena Sensor, ED  Rosezena Sensor, ED    EKG  EKG Interpretation  Date/Time:  Wednesday February 07 2016 08:41:40 EDT Ventricular Rate:  72 PR Interval:    QRS Duration: 83 QT Interval:  347 QTC Calculation: 380 R Axis:   67 Text Interpretation:  Sinus rhythm RSR' in V1 or V2, probably normal variant ST elev, probable normal early repol pattern Artifact in lead(s) V5 V6 I III No old tracing to compare Confirmed by KOHUT  MD, STEPHEN [phone]) on 02/07/2016 8:55:55 AM       Radiology Dg Chest 2 View  Result Date: 02/07/2016 CLINICAL DATA:  Severe left upper chest pressure and pain for 2 days, smoking history EXAM: CHEST  2 VIEW COMPARISON:  None. FINDINGS: No active infiltrate or effusion is seen. No pneumothorax is noted. Mediastinal and hilar contours are unremarkable. The heart is within normal limits in size. No bony abnormality is seen. IMPRESSION: No active cardiopulmonary disease. Electronically Signed   By: Dwyane Dee M.D.   On: 02/07/2016 11:05   Ct Angio Chest Aorta W And/or Wo Contrast  Result Date: 02/07/2016 CLINICAL DATA:  40 year old male with a history of left-sided chest pain EXAM: CT CHEST WITH CONTRAST TECHNIQUE: Multidetector CT imaging of the chest was performed during intravenous contrast administration. CONTRAST:  100 cc Isovue 3 7 COMPARISON:  None. FINDINGS: Vascular: Unremarkable course caliber and contour of the thoracic aorta. No significant atherosclerotic changes. No aneurysm or dissection flap. 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 This vessel contributes to the right subclavian artery, right common carotid artery, and left common carotid artery. Left subclavian artery originates  in the expected location, more distal to the proximal brachiocephalic trunk. No central, lobar, segmental, or subsegmental filling defects of the pulmonary arteries. Cardiac: Heart size within normal limits. No pericardial fluid/ thickening. No significant coronary calcifications. Mediastinum/Nodes: No enlarged mediastinal, hilar, or axillary lymph nodes. Thyroid gland, trachea, and esophagus demonstrate no significant findings. Lungs/Pleura: Lungs are clear. No pleural effusion or pneumothorax. Upper Abdomen: No acute abnormality. Musculoskeletal: No chest wall abnormality. No acute or significant osseous findings. IMPRESSION: No acute finding to account for the patient's symptoms. Signed, Yvone Neu. Loreta Ave, DO [facility] Radiology Electronically Signed   By: Gilmer Mor D.O.   On: 02/07/2016 12:33    Procedures Procedures (including critical care time)  Medications Ordered in ED Medications  morphine 4 MG/ML injection 4 mg (4 mg Intravenous Given 02/07/16 1001)  ondansetron (ZOFRAN) injection 4 mg (4 mg Intravenous Given 02/07/16 1001)  HYDROmorphone (DILAUDID) injection 0.5 mg (0.5 mg Intravenous Given  02/07/16 1157)  iopamidol (ISOVUE-370) 76 % injection (100 mLs  Contrast Given 02/07/16 1203)     Initial Impression / Assessment and Plan / ED Course  I have reviewed the triage vital signs and the nursing notes.  Pertinent labs & imaging results that were available during my care of the patient were reviewed by me and considered in my medical decision making (see chart for details).  Clinical Course    Vitals:   02/07/16 1145 02/07/16 1415 02/07/16 1445 02/07/16 1457  BP: (!) 151/118 (!) 161/106 (!) 155/103 155/87  Pulse: 62 (!) 39 82 78  Resp: 13 18  18   Temp:    98.1 F (36.7 C)  TempSrc:    Oral  SpO2: 99% 95% 96% 99%  Weight:      Height:        Medications  morphine 4 MG/ML injection 4 mg (4 mg Intravenous Given 02/07/16 1001)   ondansetron (ZOFRAN) injection 4 mg (4 mg Intravenous Given 02/07/16 1001)  HYDROmorphone (DILAUDID) injection 0.5 mg (0.5 mg Intravenous Given 02/07/16 1157)  iopamidol (ISOVUE-370) 76 % injection (100 mLs  Contrast Given 02/07/16 1203)    Cherly HensenKevin J Saidi is 40 y.o. male presenting with left shoulder pain radiating to the front left chest, described as pressure-like onset today morning. Pain has been constant, he states it's exacerbated by movement and position.  EKG with no acute changes, troponin negative, chest x-ray negative. Patient given morphine with little relief, she is in some pretty severe pain, given his high blood pressure and smoking, will obtain CT dissection study.  Dissection study is negative, delta troponin is negative, patient's pain remains severe, blood pressure is elevated but I doubt that this is a symptomatic hypertension, discussed with attending who agrees that this patient is safe for discharge. Patient is given outpatient resources guide to establish primary care.  Evaluation does not show pathology that would require ongoing emergent intervention or inpatient treatment. Pt is hemodynamically stable and mentating appropriately. Discussed findings and plan with patient/guardian, who agrees with care plan. All questions answered. Return precautions discussed and outpatient follow up given.      Final Clinical Impressions(s) / ED Diagnoses   Final diagnoses:  Musculoskeletal chest pain  Elevated blood pressure reading    New Prescriptions Discharge Medication List as of 02/07/2016  2:30 PM    START taking these medications   Details  methocarbamol (ROBAXIN) 500 MG tablet Take 2 tablets (1,000 mg total) by mouth 4 (four) times daily as needed (Pain)., Starting Wed 02/07/2016, [employer]         Maily Debarge, PA-C 02/07/16 1610    Raeford RazorStephen Kohut, MD 02/12/16 [phone]

## 2016-02-07 NOTE — ED Notes (Signed)
Pt taken to CT.

## 2016-02-07 NOTE — Discharge Instructions (Signed)
For pain control you may take up to 800mg  of Motrin (also known as ibuprofen). That is usually 4 over the counter pills,  3 times a day. Take with food to minimize stomach irritation   You can also take  tylenol (acetaminophen) 975mg  (this is 3 over the counter pills) four times a day. Do not drink alcohol or combine with other medications that have acetaminophen as an ingredient (Read the labels!).    For breakthrough pain you may take Robaxin. Do not drink alcohol, drive or operate heavy machinery when taking Robaxin.  Do not hesitate to return to the emergency room for any new, worsening or concerning symptoms.  Please obtain primary care using resource guide below. Let them know that you were seen in the emergency room and that they will need to obtain records for further outpatient management.  Please follow with your primary care doctor in the next 5 days for high blood pressure evaluation. If you do not have a primary care doctor, present to urgent care. Reduce salt intake. Seek emergency medical care for unilateral weakness, slurring, change in vision, or chest pain and shortness of breath.

## 2016-02-07 NOTE — ED Triage Notes (Addendum)
[facility] with initial c/o cp since yesterday morning. Pain L sided, described as "charley horse" feeling. EMS gave 1 NTG tab, 324mg  ASA. Pt then stated pain originated in L neck, radiated to L shoulder, "feels like a muscle tugging at my heart". Denies n/v, sob or any recent trauma to area. Pt tearful, states 10/10 pain, has not taken any meds for the pain.

## 2016-02-09 ENCOUNTER — Emergency Department (HOSPITAL_COMMUNITY)
Admission: EM | Admit: 2016-02-09 | Discharge: 2016-02-09 | Disposition: A | Discharge status: Home / Self Care | Payer: Self-pay | Attending: Emergency Medicine | Admitting: Emergency Medicine

## 2016-02-09 ENCOUNTER — Encounter (HOSPITAL_COMMUNITY): Payer: Self-pay

## 2016-02-09 DIAGNOSIS — M62838 Other muscle spasm: Secondary | ICD-10-CM | POA: Insufficient documentation

## 2016-02-09 DIAGNOSIS — F17210 Nicotine dependence, cigarettes, uncomplicated: Secondary | ICD-10-CM | POA: Insufficient documentation

## 2016-02-09 DIAGNOSIS — M542 Cervicalgia: Secondary | ICD-10-CM

## 2016-02-09 LAB — I-STAT TROPONIN, ED: Troponin i, poc: 0 ng/mL (ref 0.00–0.08)

## 2016-02-09 LAB — I-STAT CHEM 8, ED
BUN: 17 mg/dL (ref 6–20)
CALCIUM ION: 1.16 mmol/L (ref 1.15–1.40)
CHLORIDE: 96 mmol/L — AB (ref 101–111)
Creatinine, Ser: 1.2 mg/dL (ref 0.61–1.24)
GLUCOSE: 108 mg/dL — AB (ref 65–99)
HCT: 52 % (ref 39.0–52.0)
Hemoglobin: 17.7 g/dL — ABNORMAL HIGH (ref 13.0–17.0)
Potassium: 4.1 mmol/L (ref 3.5–5.1)
Sodium: 139 mmol/L (ref 135–145)
TCO2: 32 mmol/L (ref 0–100)

## 2016-02-09 MED ORDER — DIAZEPAM 5 MG PO TABS
5.0000 mg | ORAL_TABLET | Freq: Once | ORAL | Status: AC
  Administered 2016-02-09: 5 mg via ORAL
  Filled 2016-02-09: qty 1

## 2016-02-09 MED ORDER — NAPROXEN 500 MG PO TABS: 500.0000 mg | ORAL_TABLET | Freq: Two times a day (BID) | ORAL | 0 refills | Status: AC

## 2016-02-09 MED ORDER — DIAZEPAM 5 MG PO TABS: 5.0000 mg | ORAL_TABLET | Freq: Two times a day (BID) | ORAL | 0 refills | Status: AC

## 2016-02-09 MED ORDER — HYDROCODONE-ACETAMINOPHEN 5-325 MG PO TABS
1.0000 | ORAL_TABLET | Freq: Once | ORAL | Status: AC
  Administered 2016-02-09: 1 via ORAL
  Filled 2016-02-09: qty 1

## 2016-02-09 NOTE — ED Provider Notes (Addendum)
[facility]-EMERGENCY DEPT Provider Note   CSN: [id number] Arrival date & time: 02/09/16  0258  By signing my name below, I, Modena JanskyAlbert Thayil, attest that this documentation has been prepared under the direction and in the presence of Derwood KaplanAnkit Donyel Nester, MD . Electronically Signed: Modena JanskyAlbert Thayil, Scribe. 02/09/2016. 3:44 AM.  History   Chief Complaint Chief Complaint  Patient presents with  . Spasms   The history is provided by the patient and medical records. No language interpreter was used.   HPI Comments: Joshua Mcmillan is a 40 y.o. male who presents to the Emergency Department complaining of constant moderate mid-back pain that started 4 days ago. Per medical record, pt was seen in the ED on 02/07/16 for the same complaint and prescribed daily 1000 mg robaxin. He reports waking up in the morning with pain without any known trauma. He reports no change in progression of pain since ED visit. He states the pain radiates into his left shoulder and the left side of his chest. His back pain was unrelieved by robaxin and lending forward. He reports ambulation and laying supine are alleviating factors. He describes the chest pain as a pressure sensation exacerbated by LUE movement.  He admits to smoking .5 PPD. He denies any cocaine use, numbness/tingling, visual disturbance, or inability to ambulate.   History reviewed. No pertinent past medical history.  There are no active problems to display for this patient.   Past Surgical History:  Procedure Laterality Date  . STOMACH SURGERY         Home Medications    Prior to Admission medications   Medication Sig Start Date End Date Taking? Authorizing Provider  diazepam (VALIUM) 5 MG tablet Take 1 tablet (5 mg total) by mouth 2 (two) times daily. 02/09/16   Derwood KaplanAnkit Caylor Tallarico, MD  HYDROcodone-acetaminophen (NORCO/VICODIN) 5-325 MG tablet Take 1 tablet by mouth every 6 (six) hours as needed. Patient not taking: Reported on 02/07/2016 07/06/15   Rolm GalaStevi Barrett,  PA-C  naproxen (NAPROSYN) 500 MG tablet Take 1 tablet (500 mg total) by mouth 2 (two) times daily with a meal. 02/09/16   Derwood KaplanAnkit Ollie Delano, MD  traMADol (ULTRAM) 50 MG tablet Take 1 tablet (50 mg total) by mouth every 6 (six) hours as needed for pain. Patient not taking: Reported on 02/07/2016 06/24/12   Kathrynn Speedobyn M Hess, PA-C    Family History History reviewed. No pertinent family history.  Social History Social History  Substance Use Topics  . Smoking status: Current Every Day Smoker    Packs/day: 0.50    Types: Cigarettes  . Smokeless tobacco: Never Used  . Alcohol use Yes     Comment: "on the weekends"     Allergies   Review of patient's allergies indicates no known allergies.   Review of Systems Review of Systems 10 Systems reviewed and all are negative for acute change except as noted in the HPI.  Physical Exam Updated Vital Signs BP (!) 175/110 (BP Location: Right Arm)   Pulse 87   Temp 98.2 F (36.8 C) (Oral)   Resp 18   SpO2 100%   Physical Exam  Constitutional: He is oriented to person, place, and time. He appears well-developed and well-nourished. No distress.  HENT:  Head: Normocephalic and atraumatic.  Right Ear: External ear normal.  Left Ear: External ear normal.  Eyes: Conjunctivae are normal. Right eye exhibits no discharge. Left eye exhibits no discharge. No scleral icterus.  EOM intact.   Neck: Neck supple. No tracheal deviation present.  No nuchal rigidity. ROM intact however increased tenderness when pt turns to the left side.   Cardiovascular: Normal rate and intact distal pulses.   Pulmonary/Chest: Effort normal. No stridor. No respiratory distress.  No stridor.  Abdominal: He exhibits no distension.  Musculoskeletal: He exhibits no edema.  Reproducible tenderness to the left scapular area and left cerivcal spine with palpation. Left shoulder ROM is normal.   Neurological: He is alert and oriented to person, place, and time. No cranial nerve deficit  (no gross deficits). Coordination normal.  Cranial nerves 2-12 intact. Cerebellar exam shows no dysmetria.   Skin: Skin is warm and dry. No rash noted.  Psychiatric: He has a normal mood and affect.  Nursing note and vitals reviewed.    ED Treatments / Results  DIAGNOSTIC STUDIES: Oxygen Saturation is 100% on RA, normal by my interpretation.    COORDINATION OF CARE: 3:40 AM- Pt advised of plan for treatment and pt agrees.  Labs (all labs ordered are listed, but only abnormal results are displayed) Labs Reviewed  I-STAT CHEM 8, ED - Abnormal; Notable for the following:       Result Value   Chloride 96 (*)    Glucose, Bld 108 (*)    Hemoglobin 17.7 (*)    All other components within normal limits  I-STAT TROPOININ, ED    EKG  EKG Interpretation  Date/Time:  Friday February 09 2016 04:39:57 EDT Ventricular Rate:  59 PR Interval:    QRS Duration: 82 QT Interval:  378 QTC Calculation: 375 R Axis:   60 Text Interpretation:  Sinus rhythm Probable left atrial enlargement RSR' in V1 or V2, probably normal variant ST elev, probable normal early repol pattern No old tracing to compare Confirmed by Rhunette Croft, MD, Janey Genta [phone]) on 02/09/2016 5:39:56 AM       Radiology Dg Chest 2 View  Result Date: 02/07/2016 CLINICAL DATA:  Severe left upper chest pressure and pain for 2 days, smoking history EXAM: CHEST  2 VIEW COMPARISON:  None. FINDINGS: No active infiltrate or effusion is seen. No pneumothorax is noted. Mediastinal and hilar contours are unremarkable. The heart is within normal limits in size. No bony abnormality is seen. IMPRESSION: No active cardiopulmonary disease. Electronically Signed   By: Dwyane Dee M.D.   On: 02/07/2016 11:05   Ct Angio Chest Aorta W And/or Wo Contrast  Result Date: 02/07/2016 CLINICAL DATA:  40 year old male with a history of left-sided chest pain EXAM: CT CHEST WITH CONTRAST TECHNIQUE: Multidetector CT imaging of the chest was performed during  intravenous contrast administration. CONTRAST:  100 cc Isovue 3 7 COMPARISON:  None. FINDINGS: Vascular: Unremarkable course caliber and contour of the thoracic aorta. No significant atherosclerotic changes. No aneurysm or dissection flap. 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 This vessel contributes to the right subclavian artery, right common carotid artery, and left common carotid artery. Left subclavian artery originates in the expected location, more distal to the proximal brachiocephalic trunk. No central, lobar, segmental, or subsegmental filling defects of the pulmonary arteries. Cardiac: Heart size within normal limits. No pericardial fluid/ thickening. No significant coronary calcifications. Mediastinum/Nodes: No enlarged mediastinal, hilar, or axillary lymph nodes. Thyroid gland, trachea, and esophagus demonstrate no significant findings. Lungs/Pleura: Lungs are clear. No pleural effusion or pneumothorax. Upper Abdomen: No acute abnormality. Musculoskeletal: No chest wall abnormality. No acute or significant osseous findings. IMPRESSION: No acute finding to account for the patient's  symptoms. Signed, Yvone Neu. Loreta Ave, DO [facility] Electronically Signed   By: Gilmer Mor D.O.   On: 02/07/2016 12:33    Procedures Procedures (including critical care time)  Medications Ordered in ED Medications  HYDROcodone-acetaminophen (NORCO/VICODIN) 5-325 MG per tablet 1 tablet (1 tablet Oral Given 02/09/16 0536)  diazepam (VALIUM) tablet 5 mg (5 mg Oral Given 02/09/16 0536)     Initial Impression / Assessment and Plan / ED Course  I have reviewed the triage vital signs and the nursing notes.  Pertinent labs & imaging results that were available during my care of the patient were reviewed by me and considered in my medical decision making (see chart for details).  Clinical  Course  Comment By Time  Pt still having the pain. No carotid bruits. Still no focal neurologic findings. Pain is still worse with movement and with palpation. With neg CT dissection, normal vascular and neuro exam, no concerns for infectious process and no risks or findings consistent with vascular dissection - we will not pursue any further imaging. Derwood Kaplan, MD 10/06 [phone]      Final Clinical Impressions(s) / ED Diagnoses   Final diagnoses:  Muscle spasms of neck  Neck pain    New Prescriptions New Prescriptions   DIAZEPAM (VALIUM) 5 MG TABLET    Take 1 tablet (5 mg total) by mouth 2 (two) times daily.   NAPROXEN (NAPROSYN) 500 MG TABLET    Take 1 tablet (500 mg total) by mouth 2 (two) times daily with a meal.    I personally performed the services described in this documentation, which was scribed in my presence. The recorded information has been reviewed and is accurate.  PT comes in with cc of neck pain. Pt is noted to have neck spasm, L scapular pain. Pain moves towards the chest. Already had dissection study - and it was neg. BP is slightly high - but the vascular exam is normal, neuro exam is normal and the skin is warm. Pt denies drug use. Pt has some spasms in the neck - no nuchal rigidity. No carotid bruit. Symmetric neck and upper extremities. Pt denies any drug use.  I cant think of anything but musculoskeletal pain at this time. We will give him meds. Pt will get trop and ekg.   Derwood Kaplan, MD 02/09/16 9604    Derwood Kaplan, MD 02/12/16 2322

## 2016-02-09 NOTE — Discharge Instructions (Signed)
°  All the results in the ER are normal, labs and imaging. We are not sure what is causing your symptoms. The workup in the ER is not complete, and is limited to screening for life threatening and emergent conditions only....   Please return to the ER if your symptoms worsen.

## 2016-02-09 NOTE — ED Triage Notes (Signed)
Pt complains of muscle spasms in his neck, shoulders and upper back since Monday, [facility] and given robaxin, pt states the pain isn't any better

## 2018-01-18 ENCOUNTER — Emergency Department (HOSPITAL_COMMUNITY)
Admission: EM | Admit: 2018-01-18 | Discharge: 2018-01-18 | Disposition: A | Discharge status: Home / Self Care | Payer: Medicaid Other | Attending: Emergency Medicine | Admitting: Emergency Medicine

## 2018-01-18 ENCOUNTER — Emergency Department (HOSPITAL_COMMUNITY): Payer: Medicaid Other

## 2018-01-18 ENCOUNTER — Encounter (HOSPITAL_COMMUNITY): Payer: Self-pay | Admitting: Emergency Medicine

## 2018-01-18 DIAGNOSIS — S61422A Laceration with foreign body of left hand, initial encounter: Secondary | ICD-10-CM | POA: Diagnosis not present

## 2018-01-18 DIAGNOSIS — Y999 Unspecified external cause status: Secondary | ICD-10-CM | POA: Diagnosis not present

## 2018-01-18 DIAGNOSIS — S66822A Laceration of other specified muscles, fascia and tendons at wrist and hand level, left hand, initial encounter: Secondary | ICD-10-CM | POA: Diagnosis not present

## 2018-01-18 DIAGNOSIS — Y939 Activity, unspecified: Secondary | ICD-10-CM | POA: Insufficient documentation

## 2018-01-18 DIAGNOSIS — Z23 Encounter for immunization: Secondary | ICD-10-CM | POA: Diagnosis not present

## 2018-01-18 DIAGNOSIS — S61412A Laceration without foreign body of left hand, initial encounter: Secondary | ICD-10-CM

## 2018-01-18 DIAGNOSIS — Z79899 Other long term (current) drug therapy: Secondary | ICD-10-CM | POA: Insufficient documentation

## 2018-01-18 DIAGNOSIS — S6992XA Unspecified injury of left wrist, hand and finger(s), initial encounter: Secondary | ICD-10-CM | POA: Diagnosis present

## 2018-01-18 DIAGNOSIS — Y929 Unspecified place or not applicable: Secondary | ICD-10-CM | POA: Diagnosis not present

## 2018-01-18 DIAGNOSIS — F17210 Nicotine dependence, cigarettes, uncomplicated: Secondary | ICD-10-CM | POA: Insufficient documentation

## 2018-01-18 LAB — CBC WITH DIFFERENTIAL/PLATELET
ABS IMMATURE GRANULOCYTES: 0 10*3/uL (ref 0.0–0.1)
BASOS PCT: 1 %
Basophils Absolute: 0 10*3/uL (ref 0.0–0.1)
Eosinophils Absolute: 0 10*3/uL (ref 0.0–0.7)
Eosinophils Relative: 1 %
HCT: 48.6 % (ref 39.0–52.0)
HEMOGLOBIN: 16.3 g/dL (ref 13.0–17.0)
IMMATURE GRANULOCYTES: 0 %
LYMPHS PCT: 36 %
Lymphs Abs: 2.4 10*3/uL (ref 0.7–4.0)
MCH: 29.7 pg (ref 26.0–34.0)
MCHC: 33.5 g/dL (ref 30.0–36.0)
MCV: 88.5 fL (ref 78.0–100.0)
Monocytes Absolute: 0.4 10*3/uL (ref 0.1–1.0)
Monocytes Relative: 6 %
NEUTROS PCT: 56 %
Neutro Abs: 3.7 10*3/uL (ref 1.7–7.7)
PLATELETS: 304 10*3/uL (ref 150–400)
RBC: 5.49 MIL/uL (ref 4.22–5.81)
RDW: 13.7 % (ref 11.5–15.5)
WBC: 6.6 10*3/uL (ref 4.0–10.5)

## 2018-01-18 LAB — BASIC METABOLIC PANEL
ANION GAP: 14 (ref 5–15)
BUN: 12 mg/dL (ref 6–20)
CHLORIDE: 106 mmol/L (ref 98–111)
CO2: 20 mmol/L — ABNORMAL LOW (ref 22–32)
Calcium: 9.4 mg/dL (ref 8.9–10.3)
Creatinine, Ser: 1.22 mg/dL (ref 0.61–1.24)
GFR calc Af Amer: >60 mL/min (ref >60)
Glucose, Bld: 98 mg/dL (ref 70–99)
POTASSIUM: 4.6 mmol/L (ref 3.5–5.1)
SODIUM: 140 mmol/L (ref 135–145)

## 2018-01-18 MED ORDER — TETANUS-DIPHTH-ACELL PERTUSSIS 5-2.5-18.5 LF-MCG/0.5 IM SUSP
0.5000 mL | Freq: Once | INTRAMUSCULAR | Status: AC
  Administered 2018-01-18: 0.5 mL via INTRAMUSCULAR

## 2018-01-18 MED ORDER — CEFAZOLIN SODIUM-DEXTROSE 1-4 GM/50ML-% IV SOLN
1.0000 g | Freq: Once | INTRAVENOUS | Status: AC
  Administered 2018-01-18: 1 g via INTRAVENOUS
  Filled 2018-01-18: qty 50

## 2018-01-18 MED ORDER — MORPHINE SULFATE (PF) 4 MG/ML IV SOLN
4.0000 mg | Freq: Once | INTRAVENOUS | Status: AC
  Administered 2018-01-18: 4 mg via INTRAVENOUS
  Filled 2018-01-18: qty 1

## 2018-01-18 MED ORDER — TETANUS-DIPHTH-ACELL PERTUSSIS 5-2.5-18.5 LF-MCG/0.5 IM SUSP
INTRAMUSCULAR | Status: AC
  Filled 2018-01-18: qty 0.5

## 2018-01-18 NOTE — ED Notes (Addendum)
[facility] for pt transport to [facility].   Dr. Thomasenia BottomsMolnart accepting- hand surgery.
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
## 2018-01-18 NOTE — ED Notes (Signed)
Called Dr.Haddix per Dr. Freida BusmanAllen

## 2018-01-18 NOTE — ED Notes (Signed)
Significant bleeding from hand noted, BP cuff applied to left wrist with air pressure, bleeding is now controlled.

## 2018-01-18 NOTE — ED Notes (Signed)
[facility] transporting the pt.

## 2018-01-18 NOTE — ED Notes (Signed)
Paged [facility] for hand consult *2

## 2018-02-11 ENCOUNTER — Emergency Department (HOSPITAL_COMMUNITY)
Admission: EM | Admit: 2018-02-11 | Discharge: 2018-02-11 | Disposition: A | Discharge status: Home / Self Care | Payer: Medicaid Other | Attending: Emergency Medicine | Admitting: Emergency Medicine

## 2018-02-11 ENCOUNTER — Other Ambulatory Visit: Payer: Self-pay

## 2018-02-11 ENCOUNTER — Encounter (HOSPITAL_COMMUNITY): Payer: Self-pay | Admitting: Emergency Medicine

## 2018-02-11 ENCOUNTER — Emergency Department (HOSPITAL_COMMUNITY): Payer: Medicaid Other

## 2018-02-11 DIAGNOSIS — R1013 Epigastric pain: Secondary | ICD-10-CM | POA: Diagnosis not present

## 2018-02-11 DIAGNOSIS — M79642 Pain in left hand: Secondary | ICD-10-CM | POA: Diagnosis present

## 2018-02-11 DIAGNOSIS — R109 Unspecified abdominal pain: Secondary | ICD-10-CM

## 2018-02-11 DIAGNOSIS — Z8711 Personal history of peptic ulcer disease: Secondary | ICD-10-CM | POA: Diagnosis not present

## 2018-02-11 DIAGNOSIS — Z7982 Long term (current) use of aspirin: Secondary | ICD-10-CM | POA: Insufficient documentation

## 2018-02-11 DIAGNOSIS — F17210 Nicotine dependence, cigarettes, uncomplicated: Secondary | ICD-10-CM | POA: Diagnosis not present

## 2018-02-11 DIAGNOSIS — Z79899 Other long term (current) drug therapy: Secondary | ICD-10-CM | POA: Diagnosis not present

## 2018-02-11 LAB — COMPREHENSIVE METABOLIC PANEL
ALBUMIN: 3.9 g/dL (ref 3.5–5.0)
ALT: 15 U/L (ref 0–44)
AST: 18 U/L (ref 15–41)
Alkaline Phosphatase: 56 U/L (ref 38–126)
Anion gap: 8 (ref 5–15)
BILIRUBIN TOTAL: 0.8 mg/dL (ref 0.3–1.2)
BUN: 14 mg/dL (ref 6–20)
CALCIUM: 9.4 mg/dL (ref 8.9–10.3)
CO2: 30 mmol/L (ref 22–32)
Chloride: 103 mmol/L (ref 98–111)
Creatinine, Ser: 1.17 mg/dL (ref 0.61–1.24)
GFR calc Af Amer: >60 mL/min (ref >60)
GFR calc non Af Amer: >60 mL/min (ref >60)
GLUCOSE: 119 mg/dL — AB (ref 70–99)
Potassium: 4.2 mmol/L (ref 3.5–5.1)
SODIUM: 141 mmol/L (ref 135–145)
TOTAL PROTEIN: 6.6 g/dL (ref 6.5–8.1)

## 2018-02-11 LAB — URINALYSIS, ROUTINE W REFLEX MICROSCOPIC
BILIRUBIN URINE: NEGATIVE
GLUCOSE, UA: NEGATIVE mg/dL
HGB URINE DIPSTICK: NEGATIVE
Ketones, ur: 20 mg/dL — AB
Leukocytes, UA: NEGATIVE
Nitrite: NEGATIVE
PROTEIN: NEGATIVE mg/dL
Specific Gravity, Urine: 1.024 (ref 1.005–1.030)
pH: 8 (ref 5.0–8.0)

## 2018-02-11 LAB — CBC
HCT: 38.3 % — ABNORMAL LOW (ref 39.0–52.0)
Hemoglobin: 12.2 g/dL — ABNORMAL LOW (ref 13.0–17.0)
MCH: 28.8 pg (ref 26.0–34.0)
MCHC: 31.9 g/dL (ref 30.0–36.0)
MCV: 90.5 fL (ref 80.0–100.0)
PLATELETS: 460 10*3/uL — AB (ref 150–400)
RBC: 4.23 MIL/uL (ref 4.22–5.81)
RDW: 13.1 % (ref 11.5–15.5)
WBC: 7.5 10*3/uL (ref 4.0–10.5)
nRBC: 0 % (ref 0.0–0.2)

## 2018-02-11 LAB — LIPASE, BLOOD: Lipase: 26 U/L (ref 11–51)

## 2018-02-11 MED ORDER — KETOROLAC TROMETHAMINE 30 MG/ML IJ SOLN
30.0000 mg | Freq: Once | INTRAMUSCULAR | Status: AC
Start: 2018-02-11 — End: 2018-02-11
  Administered 2018-02-11: 30 mg via INTRAVENOUS
  Filled 2018-02-11: qty 1

## 2018-02-11 MED ORDER — SODIUM CHLORIDE 0.9 % IV BOLUS
1000.0000 mL | Freq: Once | INTRAVENOUS | Status: AC
  Administered 2018-02-11: 1000 mL via INTRAVENOUS

## 2018-02-11 MED ORDER — DICYCLOMINE HCL 20 MG PO TABS: 20.0000 mg | ORAL_TABLET | Freq: Two times a day (BID) | ORAL | 0 refills | Status: AC

## 2018-02-11 MED ORDER — MORPHINE SULFATE (PF) 4 MG/ML IV SOLN
4.0000 mg | Freq: Once | INTRAVENOUS | Status: AC
  Administered 2018-02-11: 4 mg via INTRAVENOUS
  Filled 2018-02-11: qty 1

## 2018-02-11 MED ORDER — OMEPRAZOLE 20 MG PO CPDR: 20.0000 mg | DELAYED_RELEASE_CAPSULE | Freq: Every day | ORAL | 0 refills | Status: AC

## 2018-02-11 MED ORDER — ONDANSETRON HCL 4 MG/2ML IJ SOLN
4.0000 mg | Freq: Once | INTRAMUSCULAR | Status: AC
Start: 2018-02-11 — End: 2018-02-11
  Administered 2018-02-11: 4 mg via INTRAVENOUS
  Filled 2018-02-11: qty 2

## 2018-02-11 NOTE — Discharge Instructions (Addendum)
Follow-up with her primary care doctor to be rechecked if your symptoms do not resolve, return to the emergency room for fever worsening symptoms, follow-up with your hand surgeon as previously instructed

## 2018-02-11 NOTE — ED Notes (Signed)
Ortho called to assist with Rad Gutter Splint

## 2018-02-11 NOTE — ED Triage Notes (Signed)
Pt complaint of left hand pain; unable to follow up with hand surgeon. Pt complaint of abdominal pain for 2 weeks; vomiting when attempting to eat.
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
## 2018-02-13 LAB — URINE CULTURE: CULTURE: NO GROWTH

## 2018-03-23 DIAGNOSIS — F3181 Bipolar II disorder: Secondary | ICD-10-CM | POA: Diagnosis not present

## 2018-03-26 DIAGNOSIS — F3181 Bipolar II disorder: Secondary | ICD-10-CM | POA: Diagnosis not present

## 2018-03-30 DIAGNOSIS — F913 Oppositional defiant disorder: Secondary | ICD-10-CM | POA: Diagnosis not present

## 2018-04-01 DIAGNOSIS — F913 Oppositional defiant disorder: Secondary | ICD-10-CM | POA: Diagnosis not present

## 2018-04-06 DIAGNOSIS — F913 Oppositional defiant disorder: Secondary | ICD-10-CM | POA: Diagnosis not present

## 2018-04-09 DIAGNOSIS — F913 Oppositional defiant disorder: Secondary | ICD-10-CM | POA: Diagnosis not present

## 2018-04-13 DIAGNOSIS — F4321 Adjustment disorder with depressed mood: Secondary | ICD-10-CM | POA: Diagnosis not present

## 2018-04-16 DIAGNOSIS — F4321 Adjustment disorder with depressed mood: Secondary | ICD-10-CM | POA: Diagnosis not present

## 2018-04-20 DIAGNOSIS — F4321 Adjustment disorder with depressed mood: Secondary | ICD-10-CM | POA: Diagnosis not present

## 2018-04-23 DIAGNOSIS — F4321 Adjustment disorder with depressed mood: Secondary | ICD-10-CM | POA: Diagnosis not present

## 2018-04-27 DIAGNOSIS — F4321 Adjustment disorder with depressed mood: Secondary | ICD-10-CM | POA: Diagnosis not present

## 2018-04-30 DIAGNOSIS — F4321 Adjustment disorder with depressed mood: Secondary | ICD-10-CM | POA: Diagnosis not present

## 2018-05-04 DIAGNOSIS — F4321 Adjustment disorder with depressed mood: Secondary | ICD-10-CM | POA: Diagnosis not present

## 2018-05-07 DIAGNOSIS — F4321 Adjustment disorder with depressed mood: Secondary | ICD-10-CM | POA: Diagnosis not present

## 2019-12-08 ENCOUNTER — Ambulatory Visit: Payer: Self-pay | Admitting: Nurse Practitioner

## 2019-12-11 DIAGNOSIS — G4489 Other headache syndrome: Secondary | ICD-10-CM | POA: Diagnosis not present

## 2019-12-11 DIAGNOSIS — U071 COVID-19: Secondary | ICD-10-CM | POA: Diagnosis not present

## 2019-12-11 DIAGNOSIS — I10 Essential (primary) hypertension: Secondary | ICD-10-CM | POA: Diagnosis not present

## 2019-12-11 DIAGNOSIS — R52 Pain, unspecified: Secondary | ICD-10-CM | POA: Diagnosis not present

## 2019-12-23 DIAGNOSIS — R52 Pain, unspecified: Secondary | ICD-10-CM | POA: Diagnosis not present

## 2019-12-23 DIAGNOSIS — R5381 Other malaise: Secondary | ICD-10-CM | POA: Diagnosis not present

## 2019-12-23 DIAGNOSIS — L03116 Cellulitis of left lower limb: Secondary | ICD-10-CM | POA: Diagnosis not present

## 2019-12-23 DIAGNOSIS — M79672 Pain in left foot: Secondary | ICD-10-CM | POA: Diagnosis not present

## 2019-12-29 ENCOUNTER — Ambulatory Visit: Payer: Self-pay | Admitting: Nurse Practitioner
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
## 2020-05-03 IMAGING — CR DG ABDOMEN ACUTE W/ 1V CHEST
3 series · 3 of 3 positions shown · non-contrast
Comparison: None.

CLINICAL DATA: Abdomen pain for 2 weeks.

EXAM:
DG ABDOMEN ACUTE W/ 1V CHEST

[w chest pa]
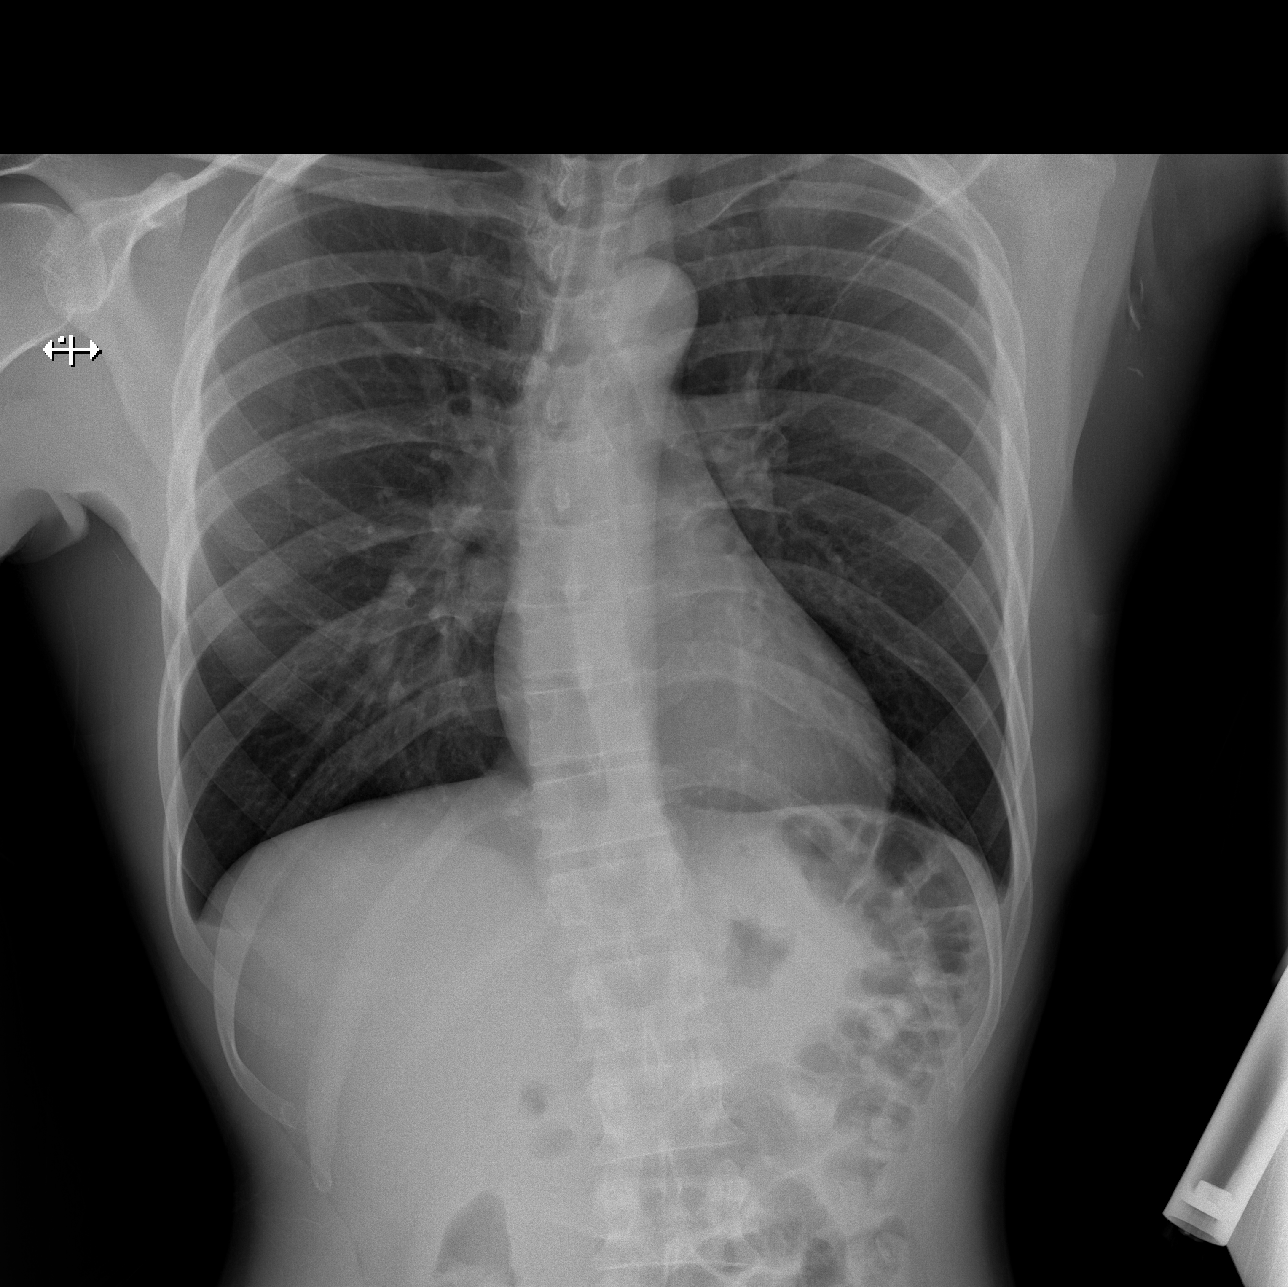

[w abdomen upright]
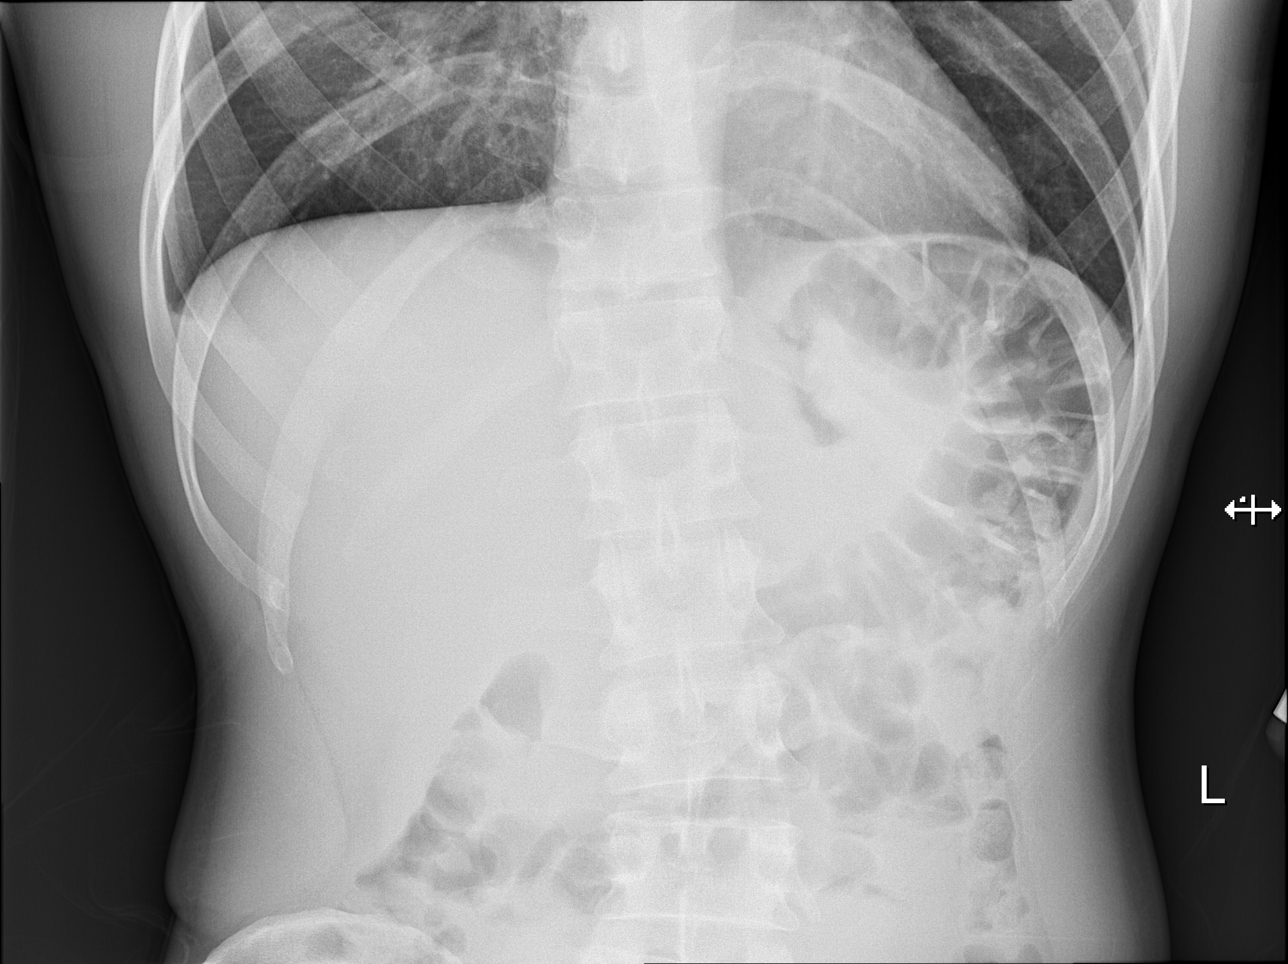

[t abdomen supine]
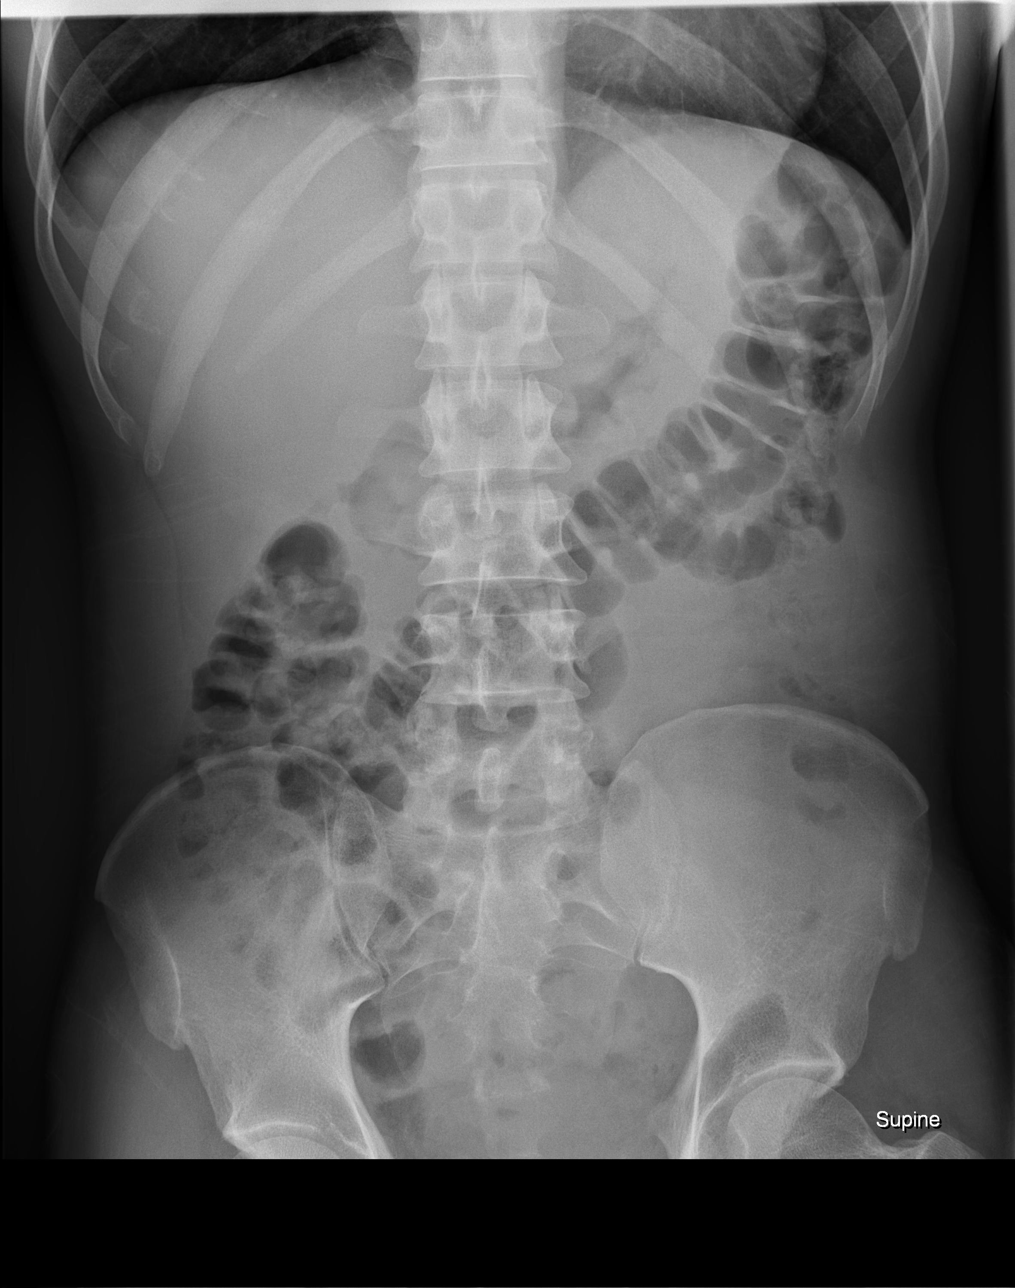

[3 of 3 positions shown; findings below may reference images not displayed]

FINDINGS: There is no evidence of dilated bowel loops or free intraperitoneal
air. No radiopaque calculi or other significant radiographic
abnormality is seen. Heart size and mediastinal contours are within
normal limits. Both lungs are clear. There is scoliosis of spine.
IMPRESSION: Negative abdominal radiographs.

No acute cardiopulmonary disease.  Scoliosis of spine.

## 2022-08-03 ENCOUNTER — Emergency Department (HOSPITAL_BASED_OUTPATIENT_CLINIC_OR_DEPARTMENT_OTHER): Payer: Medicaid Other

## 2022-08-03 ENCOUNTER — Emergency Department (HOSPITAL_BASED_OUTPATIENT_CLINIC_OR_DEPARTMENT_OTHER)
Admission: EM | Admit: 2022-08-03 | Discharge: 2022-08-03 | Disposition: A | Discharge status: Home / Self Care | Payer: Medicaid Other | Attending: Emergency Medicine | Admitting: Emergency Medicine

## 2022-08-03 DIAGNOSIS — F17210 Nicotine dependence, cigarettes, uncomplicated: Secondary | ICD-10-CM | POA: Insufficient documentation

## 2022-08-03 DIAGNOSIS — R0789 Other chest pain: Secondary | ICD-10-CM | POA: Diagnosis not present

## 2022-08-03 DIAGNOSIS — R1084 Generalized abdominal pain: Secondary | ICD-10-CM | POA: Diagnosis not present

## 2022-08-03 DIAGNOSIS — R109 Unspecified abdominal pain: Secondary | ICD-10-CM | POA: Insufficient documentation

## 2022-08-03 DIAGNOSIS — R112 Nausea with vomiting, unspecified: Secondary | ICD-10-CM | POA: Insufficient documentation

## 2022-08-03 DIAGNOSIS — R9431 Abnormal electrocardiogram [ECG] [EKG]: Secondary | ICD-10-CM | POA: Diagnosis not present

## 2022-08-03 LAB — COMPREHENSIVE METABOLIC PANEL
ALT: 11 U/L (ref 0–44)
AST: 17 U/L (ref 15–41)
Albumin: 4.2 g/dL (ref 3.5–5.0)
Alkaline Phosphatase: 62 U/L (ref 38–126)
Anion gap: 15 (ref 5–15)
BUN: 16 mg/dL (ref 6–20)
CO2: 26 mmol/L (ref 22–32)
Calcium: 10.4 mg/dL — ABNORMAL HIGH (ref 8.9–10.3)
Chloride: 98 mmol/L (ref 98–111)
Creatinine, Ser: 1.01 mg/dL (ref 0.61–1.24)
GFR, Estimated: >60 mL/min (ref >60)
Glucose, Bld: 98 mg/dL (ref 70–99)
Potassium: 4.2 mmol/L (ref 3.5–5.1)
Sodium: 139 mmol/L (ref 135–145)
Total Bilirubin: 0.7 mg/dL (ref 0.3–1.2)
Total Protein: 7.2 g/dL (ref 6.5–8.1)

## 2022-08-03 LAB — CBC WITH DIFFERENTIAL/PLATELET
Abs Immature Granulocytes: 0.01 10*3/uL (ref 0.00–0.07)
Basophils Absolute: 0 10*3/uL (ref 0.0–0.1)
Basophils Relative: 0 %
Eosinophils Absolute: 0 10*3/uL (ref 0.0–0.5)
Eosinophils Relative: 0 %
HCT: 46.5 % (ref 39.0–52.0)
Hemoglobin: 15.9 g/dL (ref 13.0–17.0)
Immature Granulocytes: 0 %
Lymphocytes Relative: 13 %
Lymphs Abs: 1.1 10*3/uL (ref 0.7–4.0)
MCH: 29.7 pg (ref 26.0–34.0)
MCHC: 34.2 g/dL (ref 30.0–36.0)
MCV: 86.8 fL (ref 80.0–100.0)
Monocytes Absolute: 0.3 10*3/uL (ref 0.1–1.0)
Monocytes Relative: 3 %
Neutro Abs: 7.2 10*3/uL (ref 1.7–7.7)
Neutrophils Relative %: 84 %
Platelets: 318 10*3/uL (ref 150–400)
RBC: 5.36 MIL/uL (ref 4.22–5.81)
RDW: 13.8 % (ref 11.5–15.5)
WBC: 8.6 10*3/uL (ref 4.0–10.5)
nRBC: 0 % (ref 0.0–0.2)

## 2022-08-03 LAB — URINALYSIS, ROUTINE W REFLEX MICROSCOPIC
Bilirubin Urine: NEGATIVE
Glucose, UA: NEGATIVE mg/dL
Hgb urine dipstick: NEGATIVE
Ketones, ur: >80 mg/dL — AB
Leukocytes,Ua: NEGATIVE
Nitrite: NEGATIVE
Specific Gravity, Urine: >1.046 — ABNORMAL HIGH (ref 1.005–1.030)
pH: 8.5 — ABNORMAL HIGH (ref 5.0–8.0)

## 2022-08-03 LAB — TROPONIN I (HIGH SENSITIVITY)
Troponin I (High Sensitivity): 5 ng/L (ref <18)
Troponin I (High Sensitivity): 7 ng/L (ref <18)

## 2022-08-03 LAB — LIPASE, BLOOD: Lipase: <10 U/L — ABNORMAL LOW (ref 11–51)

## 2022-08-03 MED ORDER — IOHEXOL 350 MG/ML SOLN
100.0000 mL | Freq: Once | INTRAVENOUS | Status: AC | PRN
  Administered 2022-08-03: 75 mL via INTRAVENOUS

## 2022-08-03 MED ORDER — PANTOPRAZOLE SODIUM 40 MG IV SOLR
40.0000 mg | Freq: Once | INTRAVENOUS | Status: AC
  Administered 2022-08-03: 40 mg via INTRAVENOUS

## 2022-08-03 MED ORDER — DROPERIDOL 2.5 MG/ML IJ SOLN
2.5000 mg | Freq: Once | INTRAMUSCULAR | Status: AC
  Administered 2022-08-03: 2.5 mg via INTRAVENOUS
  Filled 2022-08-03: qty 2

## 2022-08-03 MED ORDER — ONDANSETRON HCL 4 MG/2ML IJ SOLN
4.0000 mg | Freq: Once | INTRAMUSCULAR | Status: AC | PRN
  Administered 2022-08-03: 4 mg via INTRAVENOUS
  Filled 2022-08-03: qty 2

## 2022-08-03 MED ORDER — DIAZEPAM 5 MG/ML IJ SOLN
5.0000 mg | Freq: Once | INTRAMUSCULAR | Status: AC
  Administered 2022-08-03: 5 mg via INTRAVENOUS
  Filled 2022-08-03: qty 2

## 2022-08-03 MED ORDER — SODIUM CHLORIDE 0.9 % IV BOLUS
1000.0000 mL | Freq: Once | INTRAVENOUS | Status: AC
  Administered 2022-08-03: 1000 mL via INTRAVENOUS

## 2022-08-03 MED ORDER — MORPHINE SULFATE (PF) 4 MG/ML IV SOLN
4.0000 mg | Freq: Once | INTRAVENOUS | Status: AC
  Administered 2022-08-03: 4 mg via INTRAVENOUS
  Filled 2022-08-03: qty 1

## 2022-08-03 MED ORDER — FAMOTIDINE 20 MG PO TABS
20.0000 mg | ORAL_TABLET | Freq: Once | ORAL | Status: AC
  Administered 2022-08-03: 20 mg via ORAL
  Filled 2022-08-03: qty 1

## 2022-08-03 MED ORDER — DICYCLOMINE HCL 10 MG PO CAPS
10.0000 mg | ORAL_CAPSULE | Freq: Once | ORAL | Status: AC
  Administered 2022-08-03: 10 mg via ORAL
  Filled 2022-08-03: qty 1

## 2022-08-03 MED ORDER — METOCLOPRAMIDE HCL 10 MG PO TABS: 10.0000 mg | ORAL_TABLET | Freq: Four times a day (QID) | ORAL | 0 refills | Status: DC

## 2022-08-03 MED ORDER — ALUM & MAG HYDROXIDE-SIMETH 200-200-20 MG/5ML PO SUSP
30.0000 mL | Freq: Once | ORAL | Status: AC
  Administered 2022-08-03: 30 mL via ORAL
  Filled 2022-08-03: qty 30

## 2022-08-03 NOTE — ED Notes (Signed)
Pt stated he was also having chest pain, EKG obtained.

## 2022-08-03 NOTE — ED Provider Notes (Signed)
[facility] Provider Note   CSN: [id number] Arrival date & time: 08/03/22  1227     History {Add pertinent medical, surgical, social history, OB history to HPI:1} Chief Complaint  Patient presents with   Abdominal Pain    LORENSO ELLMAN is a 47 y.o. male.  Patient with remote history of gastric ulcer presents today with complaints of chest and abdominal pain. He states that he has had mild abdominal pain for the past week with associated nausea and vomiting. He has not been able to tolerate any oral intake. States that he vomited earlier today and felt a popping sensation in his upper abdomen and into his chest followed by severe pain that has been persistent since onset. He states that when he was a teenager he had intra-abdominal surgery for a ruptured gastric ulcer and is concerned for same. He denies any hematemesis, hematochezia, or melena. Denies shortness of breath. He denies alcohol abuse or rehab creation no drug use.  He does smoke cigarettes.  The history is provided by the patient. No language interpreter was used.  Abdominal Pain Associated symptoms: chest pain, nausea and vomiting        Home Medications Prior to Admission medications   Medication Sig Start Date End Date Taking? Authorizing Provider  aspirin 325 MG tablet Take 650 mg by mouth daily as needed for moderate pain.    [provider]  diazepam (VALIUM) 5 MG tablet Take 1 tablet (5 mg total) by mouth 2 (two) times daily. Patient not taking: Reported on 02/11/2018 02/09/16   Varney Biles, MD  dicyclomine (BENTYL) 20 MG tablet Take 1 tablet (20 mg total) by mouth 2 (two) times daily. 02/11/18   Dorie Rank, MD  gabapentin (NEURONTIN) 300 MG capsule Take 300 mg by mouth 3 (three) times daily.    [provider]  HYDROcodone-acetaminophen (NORCO/VICODIN) 5-325 MG tablet Take 1 tablet by mouth every 6 (six) hours as needed. Patient not taking: Reported  on 02/11/2018 07/06/15   Barrett, Lahoma Crocker, PA-C  naproxen (NAPROSYN) 500 MG tablet Take 1 tablet (500 mg total) by mouth 2 (two) times daily with a meal. Patient not taking: Reported on 02/11/2018 02/09/16   Varney Biles, MD  omeprazole (PRILOSEC) 20 MG capsule Take 1 capsule (20 mg total) by mouth daily. 02/11/18   Dorie Rank, MD  oxyCODONE (OXY IR/ROXICODONE) 5 MG immediate release tablet Take 5-10 mg by mouth every 6 (six) hours as needed for moderate pain or severe pain.    [provider]  traMADol (ULTRAM) 50 MG tablet Take 1 tablet (50 mg total) by mouth every 6 (six) hours as needed for pain. Patient not taking: Reported on 02/07/2016 06/24/12   Carman Ching, PA-C      Allergies    Patient has no known allergies.    Review of Systems   Review of Systems  Cardiovascular:  Positive for chest pain.  Gastrointestinal:  Positive for abdominal pain, nausea and vomiting.  All other systems reviewed and are negative.   Physical Exam Updated Vital Signs BP 130/76   Pulse 71   Temp 97.9 F (36.6 C)   Resp 17   SpO2 100%  Physical Exam Vitals and nursing note reviewed.  Constitutional:      General: He is not in acute distress.    Appearance: Normal appearance. He is normal weight. He is not ill-appearing, toxic-appearing or diaphoretic.  HENT:     Head: Normocephalic and atraumatic.  Cardiovascular:     Rate and Rhythm: Normal rate.     Heart sounds: Normal heart sounds.  Pulmonary:     Effort: Pulmonary effort is normal. No respiratory distress.     Breath sounds: Normal breath sounds.  Musculoskeletal:        General: Normal range of motion.     Cervical back: Normal range of motion.  Skin:    General: Skin is warm and dry.  Neurological:     General: No focal deficit present.     Mental Status: He is alert.  Psychiatric:        Mood and Affect: Mood normal.        Behavior: Behavior normal.     ED Results / Procedures / Treatments   Labs (all labs ordered  are listed, but only abnormal results are displayed) Labs Reviewed  COMPREHENSIVE METABOLIC PANEL - Abnormal; Notable for the following components:      Result Value   Calcium 10.4 (*)    All other components within normal limits  LIPASE, BLOOD - Abnormal; Notable for the following components:   Lipase <10 (*)    All other components within normal limits  CBC WITH DIFFERENTIAL/PLATELET  URINALYSIS, ROUTINE W REFLEX MICROSCOPIC  TROPONIN I (HIGH SENSITIVITY)  TROPONIN I (HIGH SENSITIVITY)    EKG None  Radiology CT Angio Chest/Abd/Pel for Dissection W and/or Wo Contrast  Result Date: 08/03/2022 CLINICAL DATA:  Chest and severe abdominal pain. Acute aortic syndrome suspected. EXAM: CT ANGIOGRAPHY CHEST, ABDOMEN AND PELVIS TECHNIQUE: Non-contrast CT of the chest was initially obtained. Multidetector CT imaging through the chest, abdomen and pelvis was performed using the standard protocol during bolus administration of intravenous contrast. Multiplanar reconstructed images and MIPs were obtained and reviewed to evaluate the vascular anatomy. RADIATION DOSE REDUCTION: This exam was performed according to the departmental dose-optimization program which includes automated exposure control, adjustment of the mA and/or kV according to patient size and/or use of iterative reconstruction technique. CONTRAST:  58mL OMNIPAQUE IOHEXOL 350 MG/ML SOLN COMPARISON:  Chest CTA 02/07/2016 FINDINGS: CTA CHEST FINDINGS Cardiovascular: No acute vascular findings. Specifically, no evidence of aortic aneurysm, dissection or acute pulmonary embolism. As previously noted, the right subclavian, right common and left common carotid arteries share a common origin from the aortic arch. The heart size is normal. There is no pericardial effusion. Mediastinum/Nodes: There are no enlarged mediastinal, hilar or axillary lymph nodes. The thyroid gland, trachea and esophagus demonstrate no significant findings. Lungs/Pleura: No  pleural effusion or pneumothorax. The lungs are clear. Musculoskeletal/Chest wall: No chest wall mass or suspicious osseous findings. Review of the MIP images confirms the above findings. CTA ABDOMEN AND PELVIS FINDINGS VASCULAR Aorta: Normal caliber aorta without aneurysm, dissection or significant stenosis. Celiac: Patent without evidence of aneurysm, dissection or significant stenosis. SMA: Patent without evidence of aneurysm, dissection or significant stenosis. Renals: Both renal arteries are patent without evidence of aneurysm, dissection or significant stenosis. IMA: Patent without evidence of aneurysm, dissection, vasculitis or significant stenosis. Inflow: Patent without evidence of aneurysm, dissection, vasculitis or significant stenosis. Veins: No obvious venous abnormality within the limitations of this arterial phase study. Review of the MIP images confirms the above findings. NON-VASCULAR FINDINGS Hepatobiliary: The liver is normal in density without suspicious focal abnormality. No evidence of gallstones, gallbladder wall thickening or biliary dilatation. Pancreas: Unremarkable. No pancreatic ductal dilatation or surrounding inflammatory changes. Spleen: Normal in size without focal abnormality. Adrenals/Urinary Tract: Both adrenal glands appear normal. The kidneys appear  normal without evidence of urinary tract calculus, suspicious lesion or hydronephrosis. The bladder appears normal for its degree of distention. Stomach/Bowel: The stomach appears unremarkable for its degree of distension. No evidence of bowel wall thickening, distention or surrounding inflammatory change. Lymphatic: There are no enlarged abdominal or pelvic lymph nodes. Reproductive: The prostate gland and seminal vesicles appear unremarkable. Other: No evidence of abdominal wall mass or hernia. No ascites. Musculoskeletal: No acute or significant osseous findings. Review of the MIP images confirms the above findings. IMPRESSION: No  acute or significant findings within the chest, abdomen or pelvis. Specifically, no evidence of aortic aneurysm, dissection or acute pulmonary embolism. Aortic arch variant as previously described. Electronically Signed   By: Richardean Sale M.D.   On: 08/03/2022 15:08    Procedures Procedures  {Document cardiac monitor, telemetry assessment procedure when appropriate:1}  Medications Ordered in ED Medications  ondansetron (ZOFRAN) injection 4 mg (4 mg Intravenous Given 08/03/22 1314)  sodium chloride 0.9 % bolus 1,000 mL (1,000 mLs Intravenous New Bag/Given 08/03/22 1527)  morphine (PF) 4 MG/ML injection 4 mg (4 mg Intravenous Given 08/03/22 1431)  iohexol (OMNIPAQUE) 350 MG/ML injection 100 mL (75 mLs Intravenous Contrast Given 08/03/22 1434)    ED Course/ Medical Decision Making/ A&P Clinical Course as of 08/03/22 1656  Sat Aug 03, 2022  1654 Abdominal pain x1 weeks  Vomitting today Sudden onset   [CC]    Clinical Course User Index [CC] Tretha Sciara, MD   {   Click here for ABCD2, HEART and other calculatorsREFRESH Note before signing :1}                          Medical Decision Making Amount and/or Complexity of Data Reviewed Labs: ordered. Radiology: ordered.  Risk Prescription drug management.   This patient is a 47 y.o. male who presents to the ED for concern of chest pain, abdominal pain, nausea, and vomiting, this involves an extensive number of treatment options, and is a complaint that carries with it a high risk of complications and morbidity. The emergent differential diagnosis prior to evaluation includes, but is not limited to,  ACS, aortic dissection, PE, pneumothorax, esophageal spasm or rupture, chronic angina, pneumonia, bronchitis, GERD, reflux/PUD, biliary disease, pancreatitis, costochondritis, anxiety  This is not an exhaustive differential.   Past Medical History / Co-morbidities / Social History: Hx gastric ulcer with rupture approximately 30 year  ago. No pcp   Physical Exam: Physical exam performed. The pertinent findings include: pain appears uncomfortable, abdomien soft and nontender  Lab Tests: I ordered, and personally interpreted labs.  The pertinent results include:  ***   Imaging Studies: I ordered imaging studies including ***. I independently visualized and interpreted imaging which showed ***. I agree with the radiologist interpretation.   Cardiac Monitoring:  The patient was maintained on a cardiac monitor.  My attending physician Dr. Marland Kitchen viewed and interpreted the cardiac monitored which showed an underlying rhythm of: ***. I agree with this interpretation.   Medications: I ordered medication including ***  for ***. Reevaluation of the patient after these medicines showed that the patient {resolved/improved/worsened:23923::"improved"}. I have reviewed the patients home medicines and have made adjustments as needed.  Consultations Obtained: I requested consultation with the ***,  and discussed lab and imaging findings as well as pertinent plan - they recommend: ***   Disposition: After consideration of the diagnostic results and the patients response to treatment, I feel that *** .   ***  emergency department workup does not suggest an emergent condition requiring admission or immediate intervention beyond what has been performed at this time. The plan is: ***. The patient is safe for discharge and has been instructed to return immediately for worsening symptoms, change in symptoms or any other concerns.  I discussed this case with my attending physician Dr. Marland Kitchen who cosigned this note including patient's presenting symptoms, physical exam, and planned diagnostics and interventions. Attending physician stated agreement with plan or made changes to plan which were implemented.     {Document critical care time when appropriate:1} {Document review of labs and clinical decision tools ie heart score, Chads2Vasc2 etc:1}   {Document your independent review of radiology images, and any outside records:1} {Document your discussion with family members, caretakers, and with consultants:1} {Document social determinants of health affecting pt's care:1} {Document your decision making why or why not admission, treatments were needed:1} Final Clinical Impression(s) / ED Diagnoses Final diagnoses:  None    Rx / DC Orders ED Discharge Orders     None

## 2022-08-03 NOTE — ED Notes (Signed)
Discharge instructions provided by edp were reinforced to pt. Pt verbalized understanding with no additional questions at this time. Pt to go home with father.

## 2022-08-05 ENCOUNTER — Telehealth (HOSPITAL_BASED_OUTPATIENT_CLINIC_OR_DEPARTMENT_OTHER): Payer: Self-pay | Admitting: Emergency Medicine

## 2022-08-05 DIAGNOSIS — R1084 Generalized abdominal pain: Secondary | ICD-10-CM

## 2022-08-05 MED ORDER — METOCLOPRAMIDE HCL 10 MG PO TABS: 10.0000 mg | ORAL_TABLET | Freq: Four times a day (QID) | ORAL | 0 refills | Status: AC

## 2022-08-05 NOTE — Telephone Encounter (Signed)
Pt called this facility requesting prescription of reglan be changed to different pharmacy. Will attempt to discontinue prior prescription and send to updated pharmacy.  [place], PA-C [facility], [place] [email or web address]

## 2022-08-12 DIAGNOSIS — R1111 Vomiting without nausea: Secondary | ICD-10-CM | POA: Diagnosis not present

## 2022-08-12 DIAGNOSIS — K7689 Other specified diseases of liver: Secondary | ICD-10-CM | POA: Diagnosis not present

## 2022-08-12 DIAGNOSIS — R1084 Generalized abdominal pain: Secondary | ICD-10-CM | POA: Diagnosis not present

## 2022-08-12 DIAGNOSIS — K3189 Other diseases of stomach and duodenum: Secondary | ICD-10-CM | POA: Diagnosis not present

## 2022-08-12 DIAGNOSIS — K253 Acute gastric ulcer without hemorrhage or perforation: Secondary | ICD-10-CM | POA: Diagnosis not present

## 2022-08-12 DIAGNOSIS — R110 Nausea: Secondary | ICD-10-CM | POA: Diagnosis not present

## 2022-08-13 DIAGNOSIS — I459 Conduction disorder, unspecified: Secondary | ICD-10-CM | POA: Diagnosis not present

## 2023-05-25 DIAGNOSIS — M549 Dorsalgia, unspecified: Secondary | ICD-10-CM | POA: Diagnosis not present
